# Patient Record
Sex: Female | Born: 1950 | Race: White | Hispanic: No | Marital: Married | Smoking: Never smoker
Health system: Southern US, Community
[De-identification: ages and names within clinical notes are randomized; demographics above are authoritative.]

## PROBLEM LIST (undated history)

## (undated) DIAGNOSIS — J45909 Unspecified asthma, uncomplicated: Secondary | ICD-10-CM

## (undated) DIAGNOSIS — K5790 Diverticulosis of intestine, part unspecified, without perforation or abscess without bleeding: Secondary | ICD-10-CM

## (undated) DIAGNOSIS — Z9289 Personal history of other medical treatment: Secondary | ICD-10-CM

## (undated) DIAGNOSIS — T4145XA Adverse effect of unspecified anesthetic, initial encounter: Secondary | ICD-10-CM

## (undated) DIAGNOSIS — I480 Paroxysmal atrial fibrillation: Secondary | ICD-10-CM

## (undated) DIAGNOSIS — J302 Other seasonal allergic rhinitis: Secondary | ICD-10-CM

## (undated) DIAGNOSIS — M1712 Unilateral primary osteoarthritis, left knee: Secondary | ICD-10-CM

## (undated) DIAGNOSIS — T8859XA Other complications of anesthesia, initial encounter: Secondary | ICD-10-CM

## (undated) DIAGNOSIS — Z8709 Personal history of other diseases of the respiratory system: Secondary | ICD-10-CM

## (undated) HISTORY — PX: TUBAL LIGATION: SHX77

## (undated) HISTORY — PX: ESOPHAGOGASTRODUODENOSCOPY: SHX1529

## (undated) HISTORY — PX: OTHER SURGICAL HISTORY: SHX169

## (undated) HISTORY — PX: REFRACTIVE SURGERY: SHX103

---

## 1977-05-21 DIAGNOSIS — Z9289 Personal history of other medical treatment: Secondary | ICD-10-CM

## 1977-05-21 HISTORY — DX: Personal history of other medical treatment: Z92.89

## 2003-02-26 ENCOUNTER — Ambulatory Visit (HOSPITAL_COMMUNITY): Admission: RE | Admit: 2003-02-26 | Discharge: 2003-02-26 | Payer: Self-pay | Admitting: Internal Medicine

## 2004-04-03 ENCOUNTER — Emergency Department (HOSPITAL_COMMUNITY): Admission: EM | Admit: 2004-04-03 | Discharge: 2004-04-03 | Payer: Self-pay | Admitting: Emergency Medicine

## 2006-08-29 ENCOUNTER — Encounter: Admission: RE | Admit: 2006-08-29 | Discharge: 2006-08-29 | Payer: Self-pay | Admitting: Obstetrics and Gynecology

## 2007-05-21 ENCOUNTER — Emergency Department (HOSPITAL_COMMUNITY): Admission: EM | Admit: 2007-05-21 | Discharge: 2007-05-21 | Payer: Self-pay | Admitting: Emergency Medicine

## 2010-10-06 NOTE — Op Note (Signed)
NAME:  Mary, [name]                           ACCOUNT NO.:  orourk   MEDICAL RECORD NO.:  [id number]                   PATIENT TYPE:  AMB   LOCATION:  DAY                                  FACILITY:  [facility]   PHYSICIAN:  R. Roetta Sessions, M.D.              DATE OF BIRTH:  07-20-1950   DATE OF PROCEDURE:  02/26/2003  DATE OF DISCHARGE:                                 OPERATIVE REPORT   PROCEDURE:  Screening colonoscopy.   INDICATIONS FOR PROCEDURE:  The patient is a 60 year old lady sent over by  the courtesy of Dr. Almetta Lovely for colorectal carcinoma screening.  She  is devoid of any lower GI tract symptoms.  There is no family history of  colorectal neoplasia.  She has never had her lower GI tract evaluated.  Colonoscopy is now being done as a standard screening maneuver.  This  approach has been discussed with the patient.  The potential risks,  benefits, and alternatives have been reviewed.  Please see my handwritten  H&P for more information.   PROCEDURE:  O2 saturation, blood pressure, pulses, and respirations were  monitored throughout the entire procedure.  Conscious sedation was with  Versed 4 mg IV, Demerol 75 mg IV in divided doses.  The instrument used was  the Olympus video chip adult colonoscope.   FINDINGS:  Digital rectal examination revealed no abnormalities.   ENDOSCOPIC FINDINGS:  The prep was good.   Rectum:  Examination of the rectal mucosa revealed a relatively narrow  rectum.  I was unable to reflex; however, I was able to see the rectum very  well en face, and it appeared normal.   Colon:  The colonic mucosa was surveyed from the rectosigmoid junction  through the left, transverse, right colon to the area of the appendiceal  orifice, ileocecal valve, and cecum.  These structures were well-seen and  photographed for the record.  The patient was noted to have scattered narrow-  mouth sigmoid diverticula.  The remainder of the colonic mucosa appeared  normal.  From the level of the cecum and ileocecal valve, the scope was  slowly withdrawn.  All previously mentioned mucosal surfaces were again  seen, and no other abnormalities were observed.  The patient tolerated the  procedure well and was reactive in endoscopy.   IMPRESSION:  1. Normal rectum.  2. A few scattered sigmoid diverticula.  The remainder of the colonic mucosa     appeared normal.    RECOMMENDATIONS:  1. Diverticulosis literature provided to [name].  2. Repeat colonoscopy in 10 years.      ___________________________________________                                            Jonathon Bellows, M.D.   RMR/MEDQ  D:  02/26/2003  T:  02/26/2003  Job:  [id number]   cc:   Almetta Lovely  [address]  [place]  [place] [address]  Fax: [phone]   Kirk Ruths, M.D.  [address]  [place]  [place] [address]  Fax: [phone]

## 2011-02-23 LAB — CBC
Hemoglobin: 14
MCHC: 34.3
MCV: 86.5
WBC: 7.2

## 2011-02-23 LAB — BASIC METABOLIC PANEL
BUN: 11
Creatinine, Ser: 0.93
GFR calc Af Amer: >60
GFR calc non Af Amer: >60
Potassium: 3.6

## 2011-02-23 LAB — CULTURE, BLOOD (ROUTINE X 2)
Culture: NO GROWTH
Culture: NO GROWTH

## 2011-02-23 LAB — DIFFERENTIAL: Monocytes Absolute: 0.6

## 2013-09-30 ENCOUNTER — Telehealth: Payer: Self-pay | Admitting: *Deleted

## 2013-09-30 DIAGNOSIS — Z1211 Encounter for screening for malignant neoplasm of colon: Secondary | ICD-10-CM

## 2013-09-30 NOTE — Telephone Encounter (Signed)
Pt called to schedule her colonoscopy. Please advise [phone]

## 2013-10-06 ENCOUNTER — Other Ambulatory Visit: Payer: Self-pay

## 2013-10-06 DIAGNOSIS — Z1211 Encounter for screening for malignant neoplasm of colon: Secondary | ICD-10-CM

## 2013-10-06 NOTE — Telephone Encounter (Signed)
Gastroenterology Pre-Procedure Review  Request Date: 10/06/2013 Requesting Physician: ON RECALL  Last colonoscopy was 02/26/2003 by Dr. Jena Gaussourk and the next was due in 10 years  PATIENT REVIEW QUESTIONS: The patient responded to the following health history questions as indicated:    1. Diabetes Melitis: no 2. Joint replacements in the past 12 months: no 3. Major health problems in the past 3 months: no 4. Has an artificial valve or MVP: no 5. Has a defibrillator: no 6. Has been advised in past to take antibiotics in advance of a procedure like teeth cleaning: no    MEDICATIONS & ALLERGIES:    Patient reports the following regarding taking any blood thinners:   Plavix? no Aspirin? no Coumadin? no  Patient confirms/reports the following medications:  Current Outpatient Prescriptions  Medication Sig Dispense Refill  . ibuprofen (ADVIL,MOTRIN) 200 MG tablet Take 200 mg by mouth every 6 (six) hours as needed. Uses occasionally      . Multiple Vitamin (MULTIVITAMIN) tablet Take 1 tablet by mouth daily.      . NON FORMULARY Calcium 600 mg with Vit D   One tablet daily      . vitamin C (ASCORBIC ACID) 250 MG tablet Take 250 mg by mouth daily.       No current facility-administered medications for this visit.    Patient confirms/reports the following allergies:  Allergies  Allergen Reactions  . Penicillins Hives and Swelling    No orders of the defined types were placed in this encounter.    AUTHORIZATION INFORMATION Primary Insurance:   ID #:   Group #:  Pre-Cert / Auth required:  Pre-Cert / Auth #:   Secondary Insurance:   ID #:   Group #:  Pre-Cert / Auth required: Pre-Cert / Auth #:   SCHEDULE INFORMATION: Procedure has been scheduled as follows:  Date: 11/02/2013                 Time:  10:30 AM Location: [facility] Short Stay  This Gastroenterology Pre-Precedure Review Form is being routed to the following provider(s): R. Roetta SessionsMichael Rourk, MD

## 2013-10-07 MED ORDER — PEG-KCL-NACL-NASULF-NA ASC-C 100 G PO SOLR: 1.0000 | ORAL | Status: DC

## 2013-10-07 NOTE — Telephone Encounter (Signed)
Rx sent to the pharmacy and instructions mailed to pt.  

## 2013-10-07 NOTE — Telephone Encounter (Signed)
Appropriate.

## 2013-10-07 NOTE — Addendum Note (Signed)
Addended by: Lavena BullionSTEWART, Johnda Billiot H on: 10/07/2013 02:38 PM   Modules accepted: Orders

## 2013-10-20 ENCOUNTER — Encounter (HOSPITAL_COMMUNITY): Payer: Self-pay | Admitting: Pharmacy Technician

## 2013-11-02 ENCOUNTER — Encounter (HOSPITAL_COMMUNITY)
Admission: RE | Disposition: A | Discharge status: Home / Self Care | Payer: Self-pay | Source: Ambulatory Visit | Attending: Internal Medicine

## 2013-11-02 ENCOUNTER — Encounter (HOSPITAL_COMMUNITY): Payer: Self-pay | Admitting: *Deleted

## 2013-11-02 ENCOUNTER — Ambulatory Visit (HOSPITAL_COMMUNITY)
Admission: RE | Admit: 2013-11-02 | Discharge: 2013-11-02 | Disposition: A | Discharge status: Home / Self Care | Payer: BC Managed Care – PPO | Source: Ambulatory Visit | Attending: Internal Medicine | Admitting: Internal Medicine

## 2013-11-02 DIAGNOSIS — K5730 Diverticulosis of large intestine without perforation or abscess without bleeding: Secondary | ICD-10-CM

## 2013-11-02 DIAGNOSIS — Z79899 Other long term (current) drug therapy: Secondary | ICD-10-CM | POA: Insufficient documentation

## 2013-11-02 DIAGNOSIS — Z1211 Encounter for screening for malignant neoplasm of colon: Secondary | ICD-10-CM | POA: Insufficient documentation

## 2013-11-02 DIAGNOSIS — Z880 Allergy status to penicillin: Secondary | ICD-10-CM | POA: Insufficient documentation

## 2013-11-02 HISTORY — PX: COLONOSCOPY: SHX5424

## 2013-11-02 HISTORY — DX: Other seasonal allergic rhinitis: J30.2

## 2013-11-02 SURGERY — COLONOSCOPY
Anesthesia: Moderate Sedation

## 2013-11-02 MED ORDER — MEPERIDINE HCL 100 MG/ML IJ SOLN
INTRAMUSCULAR | Status: AC
  Filled 2013-11-02: qty 2

## 2013-11-02 MED ORDER — MEPERIDINE HCL 100 MG/ML IJ SOLN
INTRAMUSCULAR | Status: DC | PRN
  Administered 2013-11-02: 50 mg via INTRAVENOUS
  Administered 2013-11-02: 25 mg via INTRAVENOUS

## 2013-11-02 MED ORDER — SODIUM CHLORIDE 0.9 % IV SOLN
INTRAVENOUS | Status: DC
  Administered 2013-11-02: 10:00:00 via INTRAVENOUS

## 2013-11-02 MED ORDER — STERILE WATER FOR IRRIGATION IR SOLN
Status: DC | PRN
  Administered 2013-11-02: 11:00:00

## 2013-11-02 MED ORDER — MIDAZOLAM HCL 5 MG/5ML IJ SOLN
INTRAMUSCULAR | Status: AC
  Filled 2013-11-02: qty 10

## 2013-11-02 MED ORDER — MIDAZOLAM HCL 5 MG/5ML IJ SOLN
INTRAMUSCULAR | Status: DC | PRN
  Administered 2013-11-02 (×2): 2 mg via INTRAVENOUS
  Administered 2013-11-02: 1 mg via INTRAVENOUS

## 2013-11-02 MED ORDER — ONDANSETRON HCL 4 MG/2ML IJ SOLN
INTRAMUSCULAR | Status: DC | PRN
  Administered 2013-11-02: 4 mg via INTRAVENOUS

## 2013-11-02 MED ORDER — ONDANSETRON HCL 4 MG/2ML IJ SOLN
INTRAMUSCULAR | Status: AC
  Filled 2013-11-02: qty 2

## 2013-11-02 NOTE — H&P (Signed)
@LOGO @   Primary Care Physician:  Catalina PizzaHALL, ZACH, MD Primary Gastroenterologist:  Dr. Jena Gaussourk  Pre-Procedure History & Physical: HPI:  Mary [name] is a 63 y.o. female is here for a screening colonoscopy.  No bowel symptoms. No family history of colon cancer. Last exam 2004-negative.  Past Medical History  Diagnosis Date  . Seasonal allergies     Past Surgical History  Procedure Laterality Date  . Colonoscopy    . Right knee arthroscopy      Prior to Admission medications   Medication Sig Start Date End Date Taking? Authorizing Provider  fluticasone (FLONASE) 50 MCG/ACT nasal spray Place into both nostrils daily.   Yes Historical Provider, MD  ibuprofen (ADVIL,MOTRIN) 200 MG tablet Take 200 mg by mouth every 6 (six) hours as needed. Uses occasionally   Yes Historical Provider, MD  Multiple Vitamin (MULTIVITAMIN) tablet Take 1 tablet by mouth daily.   Yes Historical Provider, MD  NON FORMULARY Calcium 600 mg with Vit D   One tablet daily   Yes Historical Provider, MD  vitamin C (ASCORBIC ACID) 250 MG tablet Take 250 mg by mouth daily.   Yes Historical Provider, MD    Allergies as of 10/06/2013 - Review Complete 10/06/2013  Allergen Reaction Noted  . Penicillins Hives and Swelling 10/06/2013    Family History  Problem Relation Age of Onset  . Colon cancer Neg Hx     History   Social History  . Marital Status: Married    Spouse Name: N/A    Number of Children: N/A  . Years of Education: N/A   Occupational History  . Not on file.   Social History Main Topics  . Smoking status: Never Smoker   . Smokeless tobacco: Not on file  . Alcohol Use: Yes     Comment: occasionally  . Drug Use: No  . Sexual Activity: Not on file   Other Topics Concern  . Not on file   Social History Narrative  . No narrative on file    Review of Systems: See HPI, otherwise negative ROS  Physical Exam: BP 136/89  Pulse 68  Temp(Src) 98.2 F (36.8 C) (Oral)  Resp 18  Ht 5' 5.5"  (1.664 m)  Wt 170 lb (77.111 kg)  BMI 27.85 kg/m2  SpO2 97% General:   Alert,  Well-developed, well-nourished, pleasant and cooperative in NAD Head:  Normocephalic and atraumatic. Eyes:  Sclera clear, no icterus.   Conjunctiva pink. Ears:  Normal auditory acuity. Nose:  No deformity, discharge,  or lesions. Mouth:  No deformity or lesions, dentition normal. Neck:  Supple; no masses or thyromegaly. Lungs:  Clear throughout to auscultation.   No wheezes, crackles, or rhonchi. No acute distress. Heart:  Regular rate and rhythm; no murmurs, clicks, rubs,  or gallops. Abdomen:  Soft, nontender and nondistended. No masses, hepatosplenomegaly or hernias noted. Normal bowel sounds, without guarding, and without rebound.   Msk:  Symmetrical without gross deformities. Normal posture. Pulses:  Normal pulses noted. Extremities:  Without clubbing or edema. Neurologic:  Alert and  oriented x4;  grossly normal neurologically. Skin:  Intact without significant lesions or rashes. Cervical Nodes:  No significant cervical adenopathy. Psych:  Alert and cooperative. Normal mood and affect.  Impression/Plan: Mary [name] is now here to undergo a screening colonoscopy. Average risk colorectal cancer screening examination. Risks, benefits, limitations, imponderables and alternatives regarding colonoscopy have been reviewed with the patient. Questions have been answered. All parties agreeable.     Notice:  This dictation was prepared with Dragon dictation along with smaller phrase technology. Any transcriptional errors that result from this process are unintentional and may not be corrected upon review.

## 2013-11-02 NOTE — Op Note (Signed)
[facility] [address] [place], [id number]   COLONOSCOPY PROCEDURE REPORT  PATIENT: Mary [name], Mary K.  MR#:         [id number] BIRTHDATE: September 08, 1950 , 63  yrs. old GENDER: Female ENDOSCOPIST: R.  Roetta SessionsMichael Ettore Trebilcock, MD FACP FACG REFERRED BY:  Catalina PizzaZach Hall, M.D. PROCEDURE DATE:  11/02/2013 PROCEDURE:     Screening colonoscopy  INDICATIONS: Average risk colorectal cancer screening examination  INFORMED CONSENT:  The risks, benefits, alternatives and imponderables including but not limited to bleeding, perforation as well as the possibility of a missed lesion have been reviewed.  The potential for biopsy, lesion removal, etc. have also been discussed.  Questions have been answered.  All parties agreeable. Please see the history and physical in the medical record for more information.  MEDICATIONS: Versed 5 mg IV and Demerol 75 mg IV in divided doses. Zofran 4 mg IV.  DESCRIPTION OF PROCEDURE:  After a digital rectal exam was performed, the EC-[id number] ([id number]  colonoscope was advanced from the anus through the rectum and colon to the area of the cecum, ileocecal valve and appendiceal orifice.  The cecum was deeply intubated.  These structures were well-seen and photographed for the record.  From the level of the cecum and ileocecal valve, the scope was slowly and cautiously withdrawn.  The mucosal surfaces were carefully surveyed utilizing scope tip deflection to facilitate fold flattening as needed.  The scope was pulled down into the rectum where a thorough examination including retroflexion was performed.    FINDINGS:  Adequate preparation. Minimal anal papilla; otherwise, normal rectum.  Left-sided diverticula; otherwise, the remainder of the colonic mucosa appeared normal.  THERAPEUTIC / DIAGNOSTIC MANEUVERS PERFORMED:  None  COMPLICATIONS: None  CECAL WITHDRAWAL TIME:  9 minutes  IMPRESSION:  Colonic diverticulosis  RECOMMENDATIONS: Return for one more  screening colonoscopy in 10 years   _______________________________ eSigned:  R. Roetta SessionsMichael Alejah Aristizabal, MD FACP [facility] 11/02/2013 11:13 AM   CC:    PATIENT NAME:  Mary [name], Mary K. MR#: [id number]

## 2013-11-02 NOTE — Discharge Instructions (Addendum)
Colonoscopy Discharge Instructions  Read the instructions outlined below and refer to this sheet in the next few weeks. These discharge instructions provide you with general information on caring for yourself after you leave the hospital. Your doctor may also give you specific instructions. While your treatment has been planned according to the most current medical practices available, unavoidable complications occasionally occur. If you have any problems or questions after discharge, call Dr. Jena Gaussourk at [phone]. ACTIVITY  You may resume your regular activity, but move at a slower pace for the next 24 hours.   Take frequent rest periods for the next 24 hours.   Walking will help get rid of the air and reduce the bloated feeling in your belly (abdomen).   No driving for 24 hours (because of the medicine (anesthesia) used during the test).    Do not sign any important legal documents or operate any machinery for 24 hours (because of the anesthesia used during the test).  NUTRITION  Drink plenty of fluids.   You may resume your normal diet as instructed by your doctor.   Begin with a light meal and progress to your normal diet. Heavy or fried foods are harder to digest and may make you feel sick to your stomach (nauseated).   Avoid alcoholic beverages for 24 hours or as instructed.  MEDICATIONS  You may resume your normal medications unless your doctor tells you otherwise.  WHAT YOU CAN EXPECT TODAY  Some feelings of bloating in the abdomen.   Passage of more gas than usual.   Spotting of blood in your stool or on the toilet paper.  IF YOU HAD POLYPS REMOVED DURING THE COLONOSCOPY:  No aspirin products for 7 days or as instructed.   No alcohol for 7 days or as instructed.   Eat a soft diet for the next 24 hours.  FINDING OUT THE RESULTS OF YOUR TEST Not all test results are available during your visit. If your test results are not back during the visit, make an appointment  with your caregiver to find out the results. Do not assume everything is normal if you have not heard from your caregiver or the medical facility. It is important for you to follow up on all of your test results.  SEEK IMMEDIATE MEDICAL ATTENTION IF:  You have more than a spotting of blood in your stool.   Your belly is swollen (abdominal distention).   You are nauseated or vomiting.   You have a temperature over 101.   You have abdominal pain or discomfort that is severe or gets worse throughout the day.     Diverticulosis information  Return for one more screening colonoscopy in 10 years  Diverticulosis Diverticulosis is a common condition that develops when small pouches (diverticula) form in the wall of the colon. The risk of diverticulosis increases with age. It happens more often in people who eat a low-fiber diet. Most individuals with diverticulosis have no symptoms. Those individuals with symptoms usually experience abdominal pain, constipation, or loose stools (diarrhea). HOME CARE INSTRUCTIONS   Increase the amount of fiber in your diet as directed by your caregiver or dietician. This may reduce symptoms of diverticulosis.  Your caregiver may recommend taking a dietary fiber supplement.  Drink at least 6 to 8 glasses of water each day to prevent constipation.  Try not to strain when you have a bowel movement.  Your caregiver may recommend avoiding nuts and seeds to prevent complications, although this is still an  uncertain benefit.  Only take over-the-counter or prescription medicines for pain, discomfort, or fever as directed by your caregiver. FOODS WITH HIGH FIBER CONTENT INCLUDE:  Fruits. Apple, peach, pear, tangerine, raisins, prunes.  Vegetables. Brussels sprouts, asparagus, broccoli, cabbage, carrot, cauliflower, romaine lettuce, spinach, summer squash, tomato, winter squash, zucchini.  Starchy Vegetables. Baked beans, kidney beans, lima beans, split peas,  lentils, potatoes (with skin).  Grains. Whole wheat bread, brown rice, bran flake cereal, plain oatmeal, white rice, shredded wheat, bran muffins. SEEK IMMEDIATE MEDICAL CARE IF:   You develop increasing pain or severe bloating.  You have an oral temperature above 102 F (38.9 C), not controlled by medicine.  You develop vomiting or bowel movements that are bloody or black. Document Released: 02/02/2004 Document Revised: 07/30/2011 Document Reviewed: 10/05/2009 [facility] Patient Information 2014 [employer] [place].

## 2013-11-04 ENCOUNTER — Encounter (HOSPITAL_COMMUNITY): Payer: Self-pay | Admitting: Internal Medicine

## 2014-05-21 DIAGNOSIS — Z8709 Personal history of other diseases of the respiratory system: Secondary | ICD-10-CM

## 2014-05-21 HISTORY — DX: Personal history of other diseases of the respiratory system: Z87.09

## 2015-01-10 ENCOUNTER — Other Ambulatory Visit: Payer: Self-pay | Admitting: Obstetrics and Gynecology

## 2015-01-10 DIAGNOSIS — R928 Other abnormal and inconclusive findings on diagnostic imaging of breast: Secondary | ICD-10-CM

## 2015-01-19 ENCOUNTER — Inpatient Hospital Stay: Admission: RE | Admit: 2015-01-19 | Payer: BC Managed Care – PPO | Source: Ambulatory Visit

## 2015-01-21 ENCOUNTER — Ambulatory Visit
Admission: RE | Admit: 2015-01-21 | Discharge: 2015-01-21 | Disposition: A | Discharge status: Home / Self Care | Payer: BC Managed Care – PPO | Source: Ambulatory Visit | Attending: Obstetrics and Gynecology | Admitting: Obstetrics and Gynecology

## 2015-01-21 ENCOUNTER — Other Ambulatory Visit: Payer: Self-pay | Admitting: Obstetrics and Gynecology

## 2015-01-21 DIAGNOSIS — R928 Other abnormal and inconclusive findings on diagnostic imaging of breast: Secondary | ICD-10-CM

## 2015-11-15 ENCOUNTER — Other Ambulatory Visit: Payer: Self-pay | Admitting: Orthopedic Surgery

## 2015-11-18 ENCOUNTER — Encounter (HOSPITAL_COMMUNITY): Payer: Self-pay

## 2015-11-18 ENCOUNTER — Encounter (HOSPITAL_COMMUNITY)
Admission: RE | Admit: 2015-11-18 | Discharge: 2015-11-18 | Disposition: A | Discharge status: Home / Self Care | Payer: BC Managed Care – PPO | Source: Ambulatory Visit | Attending: Orthopedic Surgery | Admitting: Orthopedic Surgery

## 2015-11-18 DIAGNOSIS — M1712 Unilateral primary osteoarthritis, left knee: Secondary | ICD-10-CM | POA: Insufficient documentation

## 2015-11-18 DIAGNOSIS — Z01812 Encounter for preprocedural laboratory examination: Secondary | ICD-10-CM | POA: Diagnosis not present

## 2015-11-18 DIAGNOSIS — Z01818 Encounter for other preprocedural examination: Secondary | ICD-10-CM | POA: Diagnosis not present

## 2015-11-18 HISTORY — DX: Other complications of anesthesia, initial encounter: T88.59XA

## 2015-11-18 HISTORY — DX: Adverse effect of unspecified anesthetic, initial encounter: T41.45XA

## 2015-11-18 HISTORY — DX: Unspecified asthma, uncomplicated: J45.909

## 2015-11-18 HISTORY — DX: Personal history of other diseases of the respiratory system: Z87.09

## 2015-11-18 HISTORY — DX: Diverticulosis of intestine, part unspecified, without perforation or abscess without bleeding: K57.90

## 2015-11-18 HISTORY — DX: Personal history of other medical treatment: Z92.89

## 2015-11-18 LAB — CBC
HEMATOCRIT: 42.2 % (ref 36.0–46.0)
HEMOGLOBIN: 13.8 g/dL (ref 12.0–15.0)
MCH: 28.4 pg (ref 26.0–34.0)
MCHC: 32.7 g/dL (ref 30.0–36.0)
MCV: 86.8 fL (ref 78.0–100.0)
Platelets: 250 10*3/uL (ref 150–400)
RBC: 4.86 MIL/uL (ref 3.87–5.11)
RDW: 13 % (ref 11.5–15.5)
WBC: 7 10*3/uL (ref 4.0–10.5)

## 2015-11-18 LAB — BASIC METABOLIC PANEL
ANION GAP: 5 (ref 5–15)
BUN: 9 mg/dL (ref 6–20)
CALCIUM: 9.5 mg/dL (ref 8.9–10.3)
CHLORIDE: 106 mmol/L (ref 101–111)
CO2: 28 mmol/L (ref 22–32)
Creatinine, Ser: 0.92 mg/dL (ref 0.44–1.00)
GFR calc non Af Amer: >60 mL/min (ref >60)
GLUCOSE: 98 mg/dL (ref 65–99)
POTASSIUM: 3.9 mmol/L (ref 3.5–5.1)
Sodium: 139 mmol/L (ref 135–145)

## 2015-11-18 LAB — SURGICAL PCR SCREEN
MRSA, PCR: NEGATIVE
Staphylococcus aureus: NEGATIVE

## 2015-11-18 NOTE — Pre-Procedure Instructions (Signed)
Mary RossSandra K [name]  11/18/2015      [facility] - [place], [place] - [address] [place] [address] Phone: [phone] Fax: [phone]    Your procedure is scheduled on Tues, July 11 @ 10:10 AM  [facility] at 8:00 AM  Call this number if you have problems the morning of surgery:  [phone]   Remember:  Do not eat food or drink liquids after midnight.  Take these medicines the morning of surgery with A SIP OF WATER Zyrtec(Cetirizine) and Flonase(Fluticasone)             Stop taking Aleve and Ibuprofen a week prior to surgery. No Goody's,BC's,Advil,Motrin,Fish Oil,or any Herbal Medications.    Do not wear jewelry, make-up or nail polish.  Do not wear lotions, powders, or perfumes.   Do not shave 48 hours prior to surgery.    Do not bring valuables to the hospital.  [facility] any belongings or valuables.  Contacts, dentures or bridgework may not be worn into surgery.  Leave your suitcase in the car.  After surgery it may be brought to your room.  For patients admitted to the hospital, discharge time will be determined by your treatment team.  Patients discharged the day of surgery will not be allowed to drive home.    [facility] - Preparing [facility]  Before surgery, you can play an important role.  Because skin is not sterile, your skin needs to be as free of germs as possible.  You can reduce the number of germs on you skin by washing with CHG (chlorahexidine gluconate) soap before surgery.  CHG is an antiseptic cleaner which kills germs and bonds with the skin to continue killing germs even after washing.  Please DO NOT use if you have an allergy to CHG or antibacterial soaps.  If your skin becomes reddened/irritated stop using the CHG and inform your nurse when you arrive at Short Stay.  Do not shave (including legs and underarms) for at least 48 hours prior to the  first CHG shower.  You may shave your face.  Please follow these instructions carefully:   1.  Shower with CHG Soap the night before surgery and the                                morning of Surgery.  2.  If you choose to wash your hair, wash your hair first as usual with your       normal shampoo.  3.  After you shampoo, rinse your hair and body thoroughly to remove the                      Shampoo.  4.  Use CHG as you would any other liquid soap.  You can apply chg directly       to the skin and wash gently with scrungie or a clean washcloth.  5.  Apply the CHG Soap to your body ONLY FROM THE NECK DOWN.        Do not use on open wounds or open sores.  Avoid contact with your eyes,       ears, mouth and genitals (private parts).  Wash genitals (private parts)       with your normal soap.  6.  Wash thoroughly, paying special attention  to the area where your surgery        will be performed.  7.  Thoroughly rinse your body with warm water from the neck down.  8.  DO NOT shower/wash with your normal soap after using and rinsing off       the CHG Soap.  9.  Pat yourself dry with a clean towel.            10.  Wear clean pajamas.            11.  Place clean sheets on your bed the night of your first shower and do not        sleep with pets.  Day of Surgery  Do not apply any lotions/deoderants the morning of surgery.  Please wear clean clothes to the hospital/surgery center.  :   Please read over the following fact sheets that you were given. Pain Booklet, Coughing and Deep Breathing, MRSA Information and Surgical Site Infection Prevention

## 2015-11-18 NOTE — Progress Notes (Addendum)
Cardiologist denies  Medical Md is Dr Catalina PizzaZach Hall  Echo denies  Stress test denies  Heart cath denies  EKG denies  CXR denies

## 2015-11-29 ENCOUNTER — Observation Stay (HOSPITAL_COMMUNITY)
Admission: RE | Admit: 2015-11-29 | Discharge: 2015-11-30 | Disposition: A | Discharge status: Home / Self Care | Payer: BC Managed Care – PPO | Source: Ambulatory Visit | Attending: Orthopedic Surgery | Admitting: Orthopedic Surgery

## 2015-11-29 ENCOUNTER — Inpatient Hospital Stay (HOSPITAL_COMMUNITY): Payer: BC Managed Care – PPO | Admitting: Anesthesiology

## 2015-11-29 ENCOUNTER — Inpatient Hospital Stay (HOSPITAL_COMMUNITY): Payer: BC Managed Care – PPO

## 2015-11-29 ENCOUNTER — Encounter (HOSPITAL_COMMUNITY): Payer: Self-pay | Admitting: Certified Registered Nurse Anesthetist

## 2015-11-29 ENCOUNTER — Encounter (HOSPITAL_COMMUNITY)
Admission: RE | Disposition: A | Discharge status: Home / Self Care | Payer: Self-pay | Source: Ambulatory Visit | Attending: Orthopedic Surgery

## 2015-11-29 DIAGNOSIS — J45909 Unspecified asthma, uncomplicated: Secondary | ICD-10-CM | POA: Diagnosis not present

## 2015-11-29 DIAGNOSIS — M1712 Unilateral primary osteoarthritis, left knee: Principal | ICD-10-CM | POA: Diagnosis present

## 2015-11-29 DIAGNOSIS — Z96652 Presence of left artificial knee joint: Secondary | ICD-10-CM

## 2015-11-29 HISTORY — PX: PARTIAL KNEE ARTHROPLASTY: SHX2174

## 2015-11-29 HISTORY — DX: Unilateral primary osteoarthritis, left knee: M17.12

## 2015-11-29 HISTORY — PX: KNEE ARTHROSCOPY: SHX127

## 2015-11-29 SURGERY — ARTHROSCOPY, KNEE
Anesthesia: Spinal | Site: Knee | Laterality: Left

## 2015-11-29 MED ORDER — MIDAZOLAM HCL 2 MG/2ML IJ SOLN
INTRAMUSCULAR | Status: AC
  Filled 2015-11-29: qty 2

## 2015-11-29 MED ORDER — MENTHOL 3 MG MT LOZG: 1.0000 | LOZENGE | OROMUCOSAL | Status: DC | PRN

## 2015-11-29 MED ORDER — BUPIVACAINE HCL (PF) 0.75 % IJ SOLN
INTRAMUSCULAR | Status: DC | PRN
  Administered 2015-11-29: 2 mL via INTRATHECAL

## 2015-11-29 MED ORDER — FENTANYL CITRATE (PF) 250 MCG/5ML IJ SOLN
INTRAMUSCULAR | Status: DC | PRN
  Administered 2015-11-29: 50 ug via INTRAVENOUS
  Administered 2015-11-29 (×2): 100 ug via INTRAVENOUS
  Administered 2015-11-29: 50 ug via INTRAVENOUS
  Administered 2015-11-29 (×6): 25 ug via INTRAVENOUS
  Administered 2015-11-29: 50 ug via INTRAVENOUS

## 2015-11-29 MED ORDER — PROPOFOL 10 MG/ML IV BOLUS
INTRAVENOUS | Status: DC | PRN
  Administered 2015-11-29: 20 mg via INTRAVENOUS
  Administered 2015-11-29: 80 mg via INTRAVENOUS

## 2015-11-29 MED ORDER — METHOCARBAMOL 1000 MG/10ML IJ SOLN
500.0000 mg | Freq: Four times a day (QID) | INTRAVENOUS | Status: DC | PRN
  Filled 2015-11-29: qty 5

## 2015-11-29 MED ORDER — ONDANSETRON HCL 4 MG/2ML IJ SOLN
4.0000 mg | Freq: Four times a day (QID) | INTRAMUSCULAR | Status: DC | PRN
  Administered 2015-11-30: 4 mg via INTRAVENOUS
  Filled 2015-11-29: qty 2

## 2015-11-29 MED ORDER — CEFAZOLIN SODIUM-DEXTROSE 2-4 GM/100ML-% IV SOLN
2.0000 g | INTRAVENOUS | Status: AC
  Administered 2015-11-29: 2 g via INTRAVENOUS

## 2015-11-29 MED ORDER — PROMETHAZINE HCL 25 MG/ML IJ SOLN
6.2500 mg | INTRAMUSCULAR | Status: DC | PRN
Start: 2015-11-29 — End: 2015-11-29

## 2015-11-29 MED ORDER — CALCIUM CARBONATE-VITAMIN D 600-400 MG-UNIT PO TABS: ORAL_TABLET | Freq: Every morning | ORAL | Status: DC

## 2015-11-29 MED ORDER — LIDOCAINE HCL (CARDIAC) 20 MG/ML IV SOLN
INTRAVENOUS | Status: DC | PRN
  Administered 2015-11-29: 80 mg via INTRAVENOUS

## 2015-11-29 MED ORDER — LORATADINE 10 MG PO TABS
10.0000 mg | ORAL_TABLET | Freq: Every day | ORAL | Status: DC
  Administered 2015-11-30: 10 mg via ORAL
  Filled 2015-11-29: qty 1

## 2015-11-29 MED ORDER — CEFAZOLIN SODIUM-DEXTROSE 2-4 GM/100ML-% IV SOLN
INTRAVENOUS | Status: AC
  Filled 2015-11-29: qty 100

## 2015-11-29 MED ORDER — RIVAROXABAN 10 MG PO TABS: 10.0000 mg | ORAL_TABLET | Freq: Every day | ORAL | Status: DC

## 2015-11-29 MED ORDER — OXYCODONE HCL 5 MG PO TABS
ORAL_TABLET | ORAL | Status: AC
  Filled 2015-11-29: qty 2

## 2015-11-29 MED ORDER — KETOROLAC TROMETHAMINE 15 MG/ML IJ SOLN
7.5000 mg | Freq: Four times a day (QID) | INTRAMUSCULAR | Status: AC
  Administered 2015-11-29 – 2015-11-30 (×4): 7.5 mg via INTRAVENOUS
  Filled 2015-11-29 (×3): qty 1

## 2015-11-29 MED ORDER — MAGNESIUM CITRATE PO SOLN: 1.0000 | Freq: Once | ORAL | Status: DC | PRN

## 2015-11-29 MED ORDER — FENTANYL CITRATE (PF) 250 MCG/5ML IJ SOLN
INTRAMUSCULAR | Status: AC
  Filled 2015-11-29: qty 5

## 2015-11-29 MED ORDER — SODIUM CHLORIDE 0.9 % IR SOLN
Status: DC | PRN
  Administered 2015-11-29: 1000 mL
  Administered 2015-11-29: 6000 mL

## 2015-11-29 MED ORDER — OXYCODONE HCL 5 MG/5ML PO SOLN: 5.0000 mg | Freq: Once | ORAL | Status: AC | PRN

## 2015-11-29 MED ORDER — ALBUTEROL SULFATE HFA 108 (90 BASE) MCG/ACT IN AERS: 2.0000 | INHALATION_SPRAY | Freq: Four times a day (QID) | RESPIRATORY_TRACT | Status: DC | PRN

## 2015-11-29 MED ORDER — HYDROMORPHONE HCL 1 MG/ML IJ SOLN
INTRAMUSCULAR | Status: AC
  Filled 2015-11-29: qty 1

## 2015-11-29 MED ORDER — DEXAMETHASONE SODIUM PHOSPHATE 4 MG/ML IJ SOLN
INTRAMUSCULAR | Status: DC | PRN
  Administered 2015-11-29: 10 mg via INTRAVENOUS

## 2015-11-29 MED ORDER — PROPOFOL 500 MG/50ML IV EMUL
INTRAVENOUS | Status: DC | PRN
Start: 2015-11-29 — End: 2015-11-29
  Administered 2015-11-29: 75 ug/kg/min via INTRAVENOUS

## 2015-11-29 MED ORDER — RIVAROXABAN 10 MG PO TABS
10.0000 mg | ORAL_TABLET | Freq: Every day | ORAL | Status: DC
  Administered 2015-11-30: 10 mg via ORAL
  Filled 2015-11-29: qty 1

## 2015-11-29 MED ORDER — DEXAMETHASONE SODIUM PHOSPHATE 10 MG/ML IJ SOLN
10.0000 mg | Freq: Once | INTRAMUSCULAR | Status: AC
  Administered 2015-11-30: 10 mg via INTRAVENOUS
  Filled 2015-11-29: qty 1

## 2015-11-29 MED ORDER — LACTATED RINGERS IV SOLN
INTRAVENOUS | Status: DC
  Administered 2015-11-29 (×2): via INTRAVENOUS
  Administered 2015-11-29: 50 mL/h via INTRAVENOUS

## 2015-11-29 MED ORDER — CEFAZOLIN SODIUM-DEXTROSE 2-4 GM/100ML-% IV SOLN
2.0000 g | Freq: Four times a day (QID) | INTRAVENOUS | Status: AC
  Administered 2015-11-29 (×2): 2 g via INTRAVENOUS
  Filled 2015-11-29 (×2): qty 100

## 2015-11-29 MED ORDER — ONDANSETRON HCL 4 MG PO TABS: 4.0000 mg | ORAL_TABLET | Freq: Four times a day (QID) | ORAL | Status: DC | PRN

## 2015-11-29 MED ORDER — HYDROMORPHONE HCL 1 MG/ML IJ SOLN
0.2500 mg | INTRAMUSCULAR | Status: DC | PRN
  Administered 2015-11-29 (×4): 0.5 mg via INTRAVENOUS

## 2015-11-29 MED ORDER — METOCLOPRAMIDE HCL 5 MG/ML IJ SOLN: 5.0000 mg | Freq: Three times a day (TID) | INTRAMUSCULAR | Status: DC | PRN

## 2015-11-29 MED ORDER — PROPOFOL 1000 MG/100ML IV EMUL
INTRAVENOUS | Status: AC
  Filled 2015-11-29: qty 200

## 2015-11-29 MED ORDER — SENNA 8.6 MG PO TABS
1.0000 | ORAL_TABLET | Freq: Two times a day (BID) | ORAL | Status: DC
  Administered 2015-11-29 – 2015-11-30 (×2): 8.6 mg via ORAL
  Filled 2015-11-29 (×2): qty 1

## 2015-11-29 MED ORDER — OXYCODONE HCL 5 MG PO TABS
ORAL_TABLET | ORAL | Status: AC
  Filled 2015-11-29: qty 1

## 2015-11-29 MED ORDER — FLUTICASONE PROPIONATE 50 MCG/ACT NA SUSP
1.0000 | Freq: Every day | NASAL | Status: DC
Start: 2015-11-30 — End: 2015-11-30
  Administered 2015-11-30: 1 via NASAL
  Filled 2015-11-29: qty 16

## 2015-11-29 MED ORDER — METHOCARBAMOL 500 MG PO TABS
ORAL_TABLET | ORAL | Status: AC
  Filled 2015-11-29: qty 1

## 2015-11-29 MED ORDER — HYDROMORPHONE HCL 1 MG/ML IJ SOLN
0.5000 mg | INTRAMUSCULAR | Status: DC | PRN
  Administered 2015-11-29 – 2015-11-30 (×5): 0.5 mg via INTRAVENOUS
  Filled 2015-11-29 (×6): qty 1

## 2015-11-29 MED ORDER — POLYETHYLENE GLYCOL 3350 17 G PO PACK: 17.0000 g | PACK | Freq: Every day | ORAL | Status: DC | PRN

## 2015-11-29 MED ORDER — BISACODYL 10 MG RE SUPP: 10.0000 mg | Freq: Every day | RECTAL | Status: DC | PRN

## 2015-11-29 MED ORDER — SUGAMMADEX SODIUM 200 MG/2ML IV SOLN
INTRAVENOUS | Status: AC
  Filled 2015-11-29: qty 2

## 2015-11-29 MED ORDER — KETOROLAC TROMETHAMINE 15 MG/ML IJ SOLN
INTRAMUSCULAR | Status: AC
  Filled 2015-11-29: qty 1

## 2015-11-29 MED ORDER — BACLOFEN 10 MG PO TABS: 10.0000 mg | ORAL_TABLET | Freq: Three times a day (TID) | ORAL | Status: DC

## 2015-11-29 MED ORDER — ALUM & MAG HYDROXIDE-SIMETH 200-200-20 MG/5ML PO SUSP: 30.0000 mL | ORAL | Status: DC | PRN

## 2015-11-29 MED ORDER — VITAMIN C 500 MG PO TABS: 250.0000 mg | ORAL_TABLET | Freq: Every day | ORAL | Status: DC | PRN

## 2015-11-29 MED ORDER — PHENOL 1.4 % MT LIQD: 1.0000 | OROMUCOSAL | Status: DC | PRN

## 2015-11-29 MED ORDER — DIPHENHYDRAMINE HCL 12.5 MG/5ML PO ELIX: 12.5000 mg | ORAL_SOLUTION | ORAL | Status: DC | PRN

## 2015-11-29 MED ORDER — POTASSIUM CHLORIDE IN NACL 20-0.45 MEQ/L-% IV SOLN
INTRAVENOUS | Status: DC
  Administered 2015-11-29: 17:00:00 via INTRAVENOUS
  Filled 2015-11-29 (×4): qty 1000

## 2015-11-29 MED ORDER — ACETAMINOPHEN 650 MG RE SUPP: 650.0000 mg | Freq: Four times a day (QID) | RECTAL | Status: DC | PRN

## 2015-11-29 MED ORDER — ONDANSETRON HCL 4 MG PO TABS: 4.0000 mg | ORAL_TABLET | Freq: Three times a day (TID) | ORAL | Status: DC | PRN

## 2015-11-29 MED ORDER — OXYCODONE HCL 5 MG PO TABS
5.0000 mg | ORAL_TABLET | Freq: Once | ORAL | Status: AC | PRN
  Administered 2015-11-29: 5 mg via ORAL

## 2015-11-29 MED ORDER — 0.9 % SODIUM CHLORIDE (POUR BTL) OPTIME
TOPICAL | Status: DC | PRN
  Administered 2015-11-29: 1000 mL

## 2015-11-29 MED ORDER — DOCUSATE SODIUM 100 MG PO CAPS
100.0000 mg | ORAL_CAPSULE | Freq: Two times a day (BID) | ORAL | Status: DC
Start: 2015-11-29 — End: 2015-11-30
  Administered 2015-11-29 – 2015-11-30 (×2): 100 mg via ORAL
  Filled 2015-11-29 (×2): qty 1

## 2015-11-29 MED ORDER — METOCLOPRAMIDE HCL 5 MG PO TABS: 5.0000 mg | ORAL_TABLET | Freq: Three times a day (TID) | ORAL | Status: DC | PRN

## 2015-11-29 MED ORDER — ONDANSETRON HCL 4 MG/2ML IJ SOLN
INTRAMUSCULAR | Status: AC
  Filled 2015-11-29: qty 2

## 2015-11-29 MED ORDER — METHOCARBAMOL 500 MG PO TABS
500.0000 mg | ORAL_TABLET | Freq: Four times a day (QID) | ORAL | Status: DC | PRN
  Administered 2015-11-29 – 2015-11-30 (×4): 500 mg via ORAL
  Filled 2015-11-29 (×3): qty 1

## 2015-11-29 MED ORDER — PROPOFOL 10 MG/ML IV BOLUS
INTRAVENOUS | Status: AC
  Filled 2015-11-29: qty 20

## 2015-11-29 MED ORDER — OXYCODONE HCL 5 MG PO TABS
5.0000 mg | ORAL_TABLET | ORAL | Status: DC | PRN
Start: 2015-11-29 — End: 2015-11-30
  Administered 2015-11-29 – 2015-11-30 (×7): 10 mg via ORAL
  Filled 2015-11-29 (×6): qty 2

## 2015-11-29 MED ORDER — ONE-DAILY MULTI VITAMINS PO TABS: 1.0000 | ORAL_TABLET | Freq: Every day | ORAL | Status: DC | PRN

## 2015-11-29 MED ORDER — ONDANSETRON HCL 4 MG/2ML IJ SOLN
INTRAMUSCULAR | Status: DC | PRN
  Administered 2015-11-29: 4 mg via INTRAVENOUS

## 2015-11-29 MED ORDER — MIDAZOLAM HCL 2 MG/2ML IJ SOLN
INTRAMUSCULAR | Status: DC | PRN
  Administered 2015-11-29 (×2): 1 mg via INTRAVENOUS

## 2015-11-29 MED ORDER — DEXAMETHASONE SODIUM PHOSPHATE 10 MG/ML IJ SOLN
INTRAMUSCULAR | Status: AC
  Filled 2015-11-29: qty 1

## 2015-11-29 MED ORDER — SENNA-DOCUSATE SODIUM 8.6-50 MG PO TABS: 2.0000 | ORAL_TABLET | Freq: Every day | ORAL | Status: DC

## 2015-11-29 MED ORDER — ACETAMINOPHEN 325 MG PO TABS: 650.0000 mg | ORAL_TABLET | Freq: Four times a day (QID) | ORAL | Status: DC | PRN

## 2015-11-29 MED ORDER — OXYCODONE-ACETAMINOPHEN 10-325 MG PO TABS: 1.0000 | ORAL_TABLET | Freq: Four times a day (QID) | ORAL | Status: DC | PRN

## 2015-11-29 SURGICAL SUPPLY — 97 items
APL SKNCLS STERI-STRIP NONHPOA (GAUZE/BANDAGES/DRESSINGS) ×2
BANDAGE ELASTIC 6 VELCRO ST LF (GAUZE/BANDAGES/DRESSINGS) ×4 IMPLANT
BANDAGE ESMARK 6X9 LF (GAUZE/BANDAGES/DRESSINGS) ×2 IMPLANT
BENZOIN TINCTURE PRP APPL 2/3 (GAUZE/BANDAGES/DRESSINGS) ×4 IMPLANT
BLADE CUTTER GATOR 3.5 (BLADE) IMPLANT
BLADE SAG 18X100X1.27 (BLADE) IMPLANT
BLADE SAW RECIP 87.9 MT (BLADE) IMPLANT
BLADE SAW SGTL 13X75X1.27 (BLADE) ×1 IMPLANT
BLADE SURG 11 STRL SS (BLADE) IMPLANT
BLADE SURG 15 STRL LF DISP TIS (BLADE) ×1 IMPLANT
BLADE SURG 15 STRL SS (BLADE) ×4
BLADE SURG ROTATE 9660 (MISCELLANEOUS) IMPLANT
BNDG CMPR 9X6 STRL LF SNTH (GAUZE/BANDAGES/DRESSINGS) ×2
BNDG ESMARK 6X9 LF (GAUZE/BANDAGES/DRESSINGS) ×4
BOOTCOVER CLEANROOM LRG (PROTECTIVE WEAR) IMPLANT
BOWL SMART MIX CTS (DISPOSABLE) ×4 IMPLANT
CAPT KNEE PARTIAL 2 ×4 IMPLANT
CEMENT HV SMART SET (Cement) ×5 IMPLANT
CLOSURE STERI-STRIP 1/2X4 (GAUZE/BANDAGES/DRESSINGS) ×1
CLSR STERI-STRIP ANTIMIC 1/2X4 (GAUZE/BANDAGES/DRESSINGS) ×3 IMPLANT
COVER SURGICAL LIGHT HANDLE (MISCELLANEOUS) ×4 IMPLANT
CUFF TOURNIQUET SINGLE 34IN LL (TOURNIQUET CUFF) ×4 IMPLANT
CUTTER MENISCUS 3.5MM 6/BX (BLADE) IMPLANT
DRAPE ARTHROSCOPY W/POUCH 114 (DRAPES) ×4 IMPLANT
DRAPE EXTREMITY T 121X128X90 (DRAPE) ×4 IMPLANT
DRAPE IMP U-DRAPE 54X76 (DRAPES) ×4 IMPLANT
DRAPE ORTHO SPLIT 77X108 STRL (DRAPES)
DRAPE PROXIMA HALF (DRAPES) IMPLANT
DRAPE SURG ORHT 6 SPLT 77X108 (DRAPES) IMPLANT
DRAPE U-SHAPE 47X51 STRL (DRAPES) ×4 IMPLANT
DRSG PAD ABDOMINAL 8X10 ST (GAUZE/BANDAGES/DRESSINGS) IMPLANT
DURAPREP 26ML APPLICATOR (WOUND CARE) ×4 IMPLANT
ELECT CAUTERY BLADE 6.4 (BLADE) ×4 IMPLANT
ELECT REM PT RETURN 9FT ADLT (ELECTROSURGICAL) ×4
ELECTRODE REM PT RTRN 9FT ADLT (ELECTROSURGICAL) ×2 IMPLANT
FACESHIELD STD STERILE (MASK) IMPLANT
GAUZE SPONGE 4X4 12PLY STRL (GAUZE/BANDAGES/DRESSINGS) ×4 IMPLANT
GAUZE XEROFORM 1X8 LF (GAUZE/BANDAGES/DRESSINGS) IMPLANT
GLOVE BIO SURGEON STRL SZ7.5 (GLOVE) ×4 IMPLANT
GLOVE BIOGEL PI IND STRL 8 (GLOVE) ×2 IMPLANT
GLOVE BIOGEL PI INDICATOR 8 (GLOVE) ×2
GLOVE BIOGEL PI ORTHO PRO SZ8 (GLOVE) ×2
GLOVE ORTHO TXT STRL SZ7.5 (GLOVE) ×4 IMPLANT
GLOVE PI ORTHO PRO STRL SZ8 (GLOVE) ×2 IMPLANT
GLOVE SURG ORTHO 8.0 STRL STRW (GLOVE) ×8 IMPLANT
GOWN STRL REUS W/ TWL LRG LVL3 (GOWN DISPOSABLE) ×4 IMPLANT
GOWN STRL REUS W/ TWL XL LVL3 (GOWN DISPOSABLE) ×4 IMPLANT
GOWN STRL REUS W/TWL 2XL LVL3 (GOWN DISPOSABLE) ×4 IMPLANT
GOWN STRL REUS W/TWL LRG LVL3 (GOWN DISPOSABLE) ×8
GOWN STRL REUS W/TWL XL LVL3 (GOWN DISPOSABLE) ×8
HANDPIECE INTERPULSE COAX TIP (DISPOSABLE) ×4
HOOD PEEL AWAY FACE SHEILD DIS (HOOD) ×15 IMPLANT
IMMOBILIZER KNEE 22 (SOFTGOODS) ×4 IMPLANT
IMMOBILIZER KNEE 22 UNIV (SOFTGOODS) ×4 IMPLANT
KIT BASIN OR (CUSTOM PROCEDURE TRAY) ×4 IMPLANT
KIT ROOM TURNOVER OR (KITS) ×4 IMPLANT
MANIFOLD NEPTUNE II (INSTRUMENTS) ×4 IMPLANT
NDL HYPO 21X1.5 SAFETY (NEEDLE) IMPLANT
NEEDLE 18GX1X1/2 (RX/OR ONLY) (NEEDLE) IMPLANT
NEEDLE 22X1 1/2 (OR ONLY) (NEEDLE) IMPLANT
NEEDLE HYPO 21X1.5 SAFETY (NEEDLE) IMPLANT
NEEDLE SPNL 18GX3.5 QUINCKE PK (NEEDLE) ×4 IMPLANT
NS IRRIG 1000ML POUR BTL (IV SOLUTION) ×4 IMPLANT
PACK ARTHROSCOPY DSU (CUSTOM PROCEDURE TRAY) ×4 IMPLANT
PACK BLADE SAW RECIP 70 3 PT (BLADE) ×8 IMPLANT
PACK TOTAL JOINT (CUSTOM PROCEDURE TRAY) ×4 IMPLANT
PACK UNIVERSAL I (CUSTOM PROCEDURE TRAY) ×1 IMPLANT
PAD ABD 8X10 STRL (GAUZE/BANDAGES/DRESSINGS) ×4 IMPLANT
PAD ARMBOARD 7.5X6 YLW CONV (MISCELLANEOUS) ×8 IMPLANT
PAD CAST 4YDX4 CTTN HI CHSV (CAST SUPPLIES) ×2 IMPLANT
PADDING CAST COTTON 4X4 STRL (CAST SUPPLIES) ×4
PADDING CAST COTTON 6X4 STRL (CAST SUPPLIES) ×4 IMPLANT
SET ARTHROSCOPY TUBING (MISCELLANEOUS) ×4
SET ARTHROSCOPY TUBING LN (MISCELLANEOUS) ×2 IMPLANT
SET HNDPC FAN SPRY TIP SCT (DISPOSABLE) ×2 IMPLANT
SPONGE LAP 4X18 X RAY DECT (DISPOSABLE) IMPLANT
SUCTION FRAZIER HANDLE 10FR (MISCELLANEOUS) ×2
SUCTION TUBE FRAZIER 10FR DISP (MISCELLANEOUS) ×2 IMPLANT
SUT MNCRL AB 4-0 PS2 18 (SUTURE) ×4 IMPLANT
SUT VIC AB 0 CT1 27 (SUTURE) ×4
SUT VIC AB 0 CT1 27XBRD ANBCTR (SUTURE) ×2 IMPLANT
SUT VIC AB 1 CT1 27 (SUTURE) ×4
SUT VIC AB 1 CT1 27XBRD ANBCTR (SUTURE) ×2 IMPLANT
SUT VIC AB 2-0 CT1 27 (SUTURE) ×4
SUT VIC AB 2-0 CT1 TAPERPNT 27 (SUTURE) ×2 IMPLANT
SUT VIC AB 3-0 SH 8-18 (SUTURE) ×8 IMPLANT
SYR 20ML ECCENTRIC (SYRINGE) IMPLANT
SYR 30ML LL (SYRINGE) IMPLANT
SYR 50ML LL SCALE MARK (SYRINGE) IMPLANT
SYR CONTROL 10ML LL (SYRINGE) IMPLANT
TOWEL OR 17X24 6PK STRL BLUE (TOWEL DISPOSABLE) ×4 IMPLANT
TOWEL OR 17X26 10 PK STRL BLUE (TOWEL DISPOSABLE) ×4 IMPLANT
TRAY CATH 16FR W/PLASTIC CATH (SET/KITS/TRAYS/PACK) IMPLANT
TUBE CONNECTING 12'X1/4 (SUCTIONS) ×1
TUBE CONNECTING 12X1/4 (SUCTIONS) ×3 IMPLANT
WAND HAND CNTRL MULTIVAC 90 (MISCELLANEOUS) IMPLANT
WATER STERILE IRR 1000ML POUR (IV SOLUTION) ×8 IMPLANT

## 2015-11-29 NOTE — Anesthesia Preprocedure Evaluation (Addendum)
Anesthesia Evaluation  Patient identified by MRN, date of birth, ID band Patient awake    Reviewed: Allergy & Precautions, H&P , NPO status , Patient's Chart, lab work & pertinent test results  History of Anesthesia Complications Negative for: history of anesthetic complications  Airway Mallampati: II  TM Distance: >3 FB Neck ROM: full    Dental no notable dental hx. (+) Teeth Intact, Dental Advisory Given   Pulmonary asthma ,    Pulmonary exam normal breath sounds clear to auscultation       Cardiovascular negative cardio ROS Normal cardiovascular exam Rhythm:regular Rate:Normal     Neuro/Psych  Headaches,    GI/Hepatic negative GI ROS, (+) Hepatitis -, C  Endo/Other  negative endocrine ROS  Renal/GU negative Renal ROS     Musculoskeletal  (+) Arthritis ,   Abdominal   Peds  Hematology negative hematology ROS (+)   Anesthesia Other Findings   Reproductive/Obstetrics negative OB ROS                            Anesthesia Physical Anesthesia Plan  ASA: III  Anesthesia Plan: Spinal   Post-op Pain Management:    Induction: Intravenous  Airway Management Planned:   Additional Equipment:   Intra-op Plan:   Post-operative Plan:   Informed Consent: I have reviewed the patients History and Physical, chart, labs and discussed the procedure including the risks, benefits and alternatives for the proposed anesthesia with the patient or authorized representative who has indicated his/her understanding and acceptance.   Dental Advisory Given  Plan Discussed with: Anesthesiologist, CRNA and Surgeon  Anesthesia Plan Comments:         Anesthesia Quick Evaluation

## 2015-11-29 NOTE — Anesthesia Postprocedure Evaluation (Signed)
Anesthesia Post Note  Patient: Mary [name]  Procedure(s) Performed: Procedure(s) (LRB): LEFT KNEE ARTHROSCOPY CHONDROPLASTY  (Left) LEFT UNICOMPARTMENTAL KNEE ARTHROPLASTY (Left)  Patient location during evaluation: PACU Anesthesia Type: General Level of consciousness: awake and alert Pain management: pain level controlled Vital Signs Assessment: post-procedure vital signs reviewed and stable Respiratory status: spontaneous breathing, nonlabored ventilation, respiratory function stable and patient connected to nasal cannula oxygen Cardiovascular status: blood pressure returned to baseline and stable Postop Assessment: no signs of nausea or vomiting Anesthetic complications: no    Last Vitals:  Filed Vitals:   11/29/15 1315 11/29/15 1330  BP: 144/94 148/95  Pulse: 74 75  Temp:    Resp:  16    Last Pain: There were no vitals filed for this visit.               Mary [name], Mary [name]

## 2015-11-29 NOTE — H&P (Signed)
PREOPERATIVE H&P  Chief Complaint: Left knee pain  HPI: Mary [name] is a 65 y.o. female who presents for preoperative history and physical with a diagnosis of left knee osteoarthritis . Symptoms are rated as moderate to severe, and have been worsening.  This is significantly impairing activities of daily living.  She has elected for surgical management.   She has failed injections, activity modification, anti-inflammatories, and assistive devices.  Preoperative X-rays demonstrate moderate degenerative changes, primarily in the medial compartment, there is some osteophyte formation as well in the patellofemoral region as well as to some degree on the lateral side, preoperative MRI also demonstrates evidence for degenerative changes that seem to be more localized medially, although there is questionable involvement of multiple compartments. She has failed extensive conservative measures, and wishes for arthroscopic evaluation and definitive management of her arthritic process. It is not yet clear if she will require a partial knee replacement or a total knee replacement.   Past Medical History  Diagnosis Date  . Seasonal allergies     takes Zyrtec daily and uses Flonase  . Complication of anesthesia     slow to wake up  . Asthma     has a rescue inhaler  . Pneumonia     last time about 2 yrs ago  . History of bronchitis 2016  . Headache     "mild migraine"  . Arthritis   . Joint pain   . Joint swelling   . Diverticulosis   . Anemia     as a child  . History of blood transfusion 1979    Hep C  . Hepatitis     C from Blood transfusion in 1979   Past Surgical History  Procedure Laterality Date  . Colonoscopy    . Right knee arthroscopy Right   . Colonoscopy N/A 11/02/2013    Procedure: COLONOSCOPY;  Surgeon: Corbin Ade, MD;  Location: [facility];  Service: Endoscopy;  Laterality: N/A;  10:30 AM  . Tubal ligation    . Refractive surgery    . Esophagogastroduodenoscopy      Social History   Social History  . Marital Status: Married    Spouse Name: N/A  . Number of Children: N/A  . Years of Education: N/A   Social History Main Topics  . Smoking status: Never Smoker   . Smokeless tobacco: None  . Alcohol Use: Yes     Comment: occasionally  . Drug Use: No  . Sexual Activity: Yes   Other Topics Concern  . None   Social History Narrative   Family History  Problem Relation Age of Onset  . Colon cancer Neg Hx    Allergies  Allergen Reactions  . Penicillins Hives and Swelling    Has patient had a PCN reaction causing immediate rash, facial/tongue/throat swelling, SOB or lightheadedness with hypotension: No  Has patient had a PCN reaction causing severe rash involving mucus membranes or skin  necrosis: No Has patient had a PCN reaction that required hospitalization No Has patient had a PCN reaction occurring within the last 10 years: No If all of the above answers are "NO", then may proceed with Cephalosporin use.  *Patient stated that she recalls feet swelling ONLY*  . Shellfish Allergy Diarrhea    Severe    Prior to Admission medications   Medication Sig Start Date End Date Taking? Authorizing Provider  albuterol (PROVENTIL HFA;VENTOLIN HFA) 108 (90 Base) MCG/ACT inhaler Inhale 2 puffs into the lungs every  6 (six) hours as needed for wheezing or shortness of breath.   Yes Historical Provider, MD  Calcium Carb-Cholecalciferol (CALCIUM 600 + D PO) Take 1 tablet by mouth daily as needed (for low calcium).    Yes Historical Provider, MD  cetirizine (ZYRTEC) 10 MG tablet Take 10 mg by mouth daily.   Yes Historical Provider, MD  fluticasone (FLONASE) 50 MCG/ACT nasal spray Place 1 spray into both nostrils daily.    Yes Historical Provider, MD  ibuprofen (ADVIL,MOTRIN) 200 MG tablet Take 400 mg by mouth daily as needed for moderate pain.    Yes Historical Provider, MD  Multiple Vitamin (MULTIVITAMIN) tablet Take 1 tablet by mouth daily as needed  (for supplementation).    Yes Historical Provider, MD  naproxen (NAPROSYN) 500 MG tablet Take 500 mg by mouth 2 (two) times daily as needed for mild pain.  10/02/15  Yes Historical Provider, MD  vitamin C (ASCORBIC ACID) 250 MG tablet Take 250 mg by mouth daily as needed (for immune support).    Yes Historical Provider, MD     Positive ROS: All other systems have been reviewed and were otherwise negative with the exception of those mentioned in the HPI and as above.  Physical Exam: General: Alert, no acute distress Cardiovascular: No pedal edema Respiratory: No cyanosis, no use of accessory musculature GI: No organomegaly, abdomen is soft and non-tender Skin: No lesions in the area of chief complaint Neurologic: Sensation intact distally Psychiatric: Patient is competent for consent with normal mood and affect Lymphatic: No axillary or cervical lymphadenopathy  MUSCULOSKELETAL: Left knee has positive medial joint line tenderness with moderate crepitance, painful arc of motion, 0-120 with intact stability to varus valgus and Lachman maneuver.  Assessment: Left knee pain with moderate degenerative changes, particularly in the medial compartment but also involving multiple other compartments.   Plan: Plan for Procedure(s): Left knee arthroscopy with diagnostic and potentially therapeutic intervention, probable partial knee replacement versus total knee replacement depending on intraoperative findings.  The risks benefits and alternatives were discussed with the patient including but not limited to the risks of nonoperative treatment, versus surgical intervention including infection, bleeding, nerve injury,  blood clots, cardiopulmonary complications, morbidity, mortality, among others, and they were willing to proceed.   Eulas PostLANDAU,Jenella Craigie P, MD Cell [phone]   11/29/2015 9:32 AM

## 2015-11-29 NOTE — Op Note (Signed)
11/29/2015  12:21 PM  PATIENT:  Mary [name]    PRE-OPERATIVE DIAGNOSIS:  Left knee osteoarthritis  POST-OPERATIVE DIAGNOSIS:  Left knee primary localized osteoarthritis involving primarily the medial compartment, to a significant lesser degree the patellofemoral joint and lateral compartment  PROCEDURE:  Left knee arthroscopy with chondroplasty of the patellofemoral joint, with open unicompartmental left medial knee replacement  SURGEON:  Eulas PostLANDAU,Cledis Sohn P, MD  PHYSICIAN ASSISTANT: Janace LittenBrandon Parry, OPA-C, present and scrubbed throughout the case, critical for completion in a timely fashion, and for retraction, instrumentation, and closure.  ANESTHESIA:   General  PREOPERATIVE INDICATIONS:  Mary [name] is a  65 y.o. female  who had significant left sided medial knee pain that failed conservative measures and elected for surgical management.  The risks benefits and alternatives were discussed with the patient preoperatively including but not limited to the risks of infection, bleeding, nerve injury, cardiopulmonary complications, blood clots, the need for revision surgery, among others, and the patient was willing to proceed.  OPERATIVE IMPLANTS: Biomet [employer] mobile bearing medial compartment arthroplasty femur size medium, tibia size A, bearing size 4.  OPERATIVE FINDINGS: Endstage grade 4 medial compartment osteoarthritis. The anterior cruciate ligament was intact. The PCL was intact. The lateral compartment had some chondral thinning on the tibia, although the femur appeared reasonably well-preserved, the femoral trochlea had some extensive grade grade 2 and a small area of grade 3 changes, and there was also some grade 2 and grade 3 changes under the patella, there was significant rimming osteophytes along the lateral femoral condyle and around the patella, medial compartment had extensive grade 4 changes. The patella was extremely hypermobile, and easily subluxated laterally.   Unique  aspects of the case: I debated intraoperatively extensively regarding unicompartmental replacement versus total knee replacement, and felt that there was enough cartilage preserved patellofemoral joint and lateral compartment, in combination with the fact that her symptoms were primarily medial, that the improved functional outcome of unicompartmental knee replacement outweighed the risk of potential progression of arthritis in other compartments.  Also, the tibia was fairly small, and in fact the footed jig placed an indention on the undersurface of the anterior cruciate ligament spine, although the spine remained stable and intact. Bone quality was somewhat mediocre.  OPERATIVE PROCEDURE: The patient was brought to the operating room placed in supine position. Spinal anesthesia was administered, but was not sufficient, and so she was converted to General anesthesia. IV antibiotics were given in the form of Ancef with no reaction. The lower extremity was placed in the legholder and prepped and draped in usual sterile fashion.  Time out was performed.  The leg was elevated and exsanguinated and the tourniquet was inflated. I began with the diagnostic arthroscopy, which demonstrated the above-named findings. I did use the arthroscopic shaver to perform a light chondroplasty on the patellofemoral joint. At the completion of the arthroscopy, I elected to proceed with the open portion of the case, with the decision to proceed with partial knee replacement.  Anteromedial incision was performed, and I took care to preserve the MCL. Parapatellar incision was carried out, and the osteophytes were excised, along with the medial meniscus and a small portion of the fat pad.  The extra medullary tibial cutting jig was applied, using the spoon and the 4mm G-Clamp and the 2 mm shim, and I took care to protect the anterior cruciate ligament insertion and the tibial spine. The medial collateral ligament was also  protected, and I resected my proximal  tibia, matching the anatomic slope.   The proximal tibial bony cut was removed in one piece, and I turned my attention to the femur.  The intramedullary femoral rod was placed using the drill, and then using the appropriate reference, I assembled the femoral jig, setting my posterior cutting block. I resected my posterior femur, used the 0 spigot for the anterior femur, and then measured my gap.   I then used the appropriate mill to match the extension gap to the flexion gap. The second milling was at a 3 and then again at a 4.  The gaps were then measured again with the appropriate feeler gauges. Once I had balanced flexion and extension gaps, I then completed the preparation of the femur.  I milled off the anterior aspect of the distal femur to prevent impingement. I also exposed the tibia, and selected the above-named component, and then used the cutting jig to prepare the keel slot on the tibia. I also used the awl to curette out the bone to complete the preparation of the keel. The back wall was intact.  I then placed trial components, and it was found to have excellent motion, and appropriate balance.  I then cemented the components into place, cementing the tibia first, removing all excess cement, and then cementing the femur.  All loose cement was removed.  The real polyethylene insert was applied manually, and the knee was taken through functional range of motion, and found to have excellent stability and restoration of joint motion, with excellent balance.  The wounds were irrigated copiously, and the parapatellar tissue closed with Vicryl, followed by Vicryl for the subcutaneous tissue, with routine closure with Steri-Strips and sterile gauze.  The tourniquet was released, and the patient was awakened and extubated and returned to PACU in stable and satisfactory condition. There were no complications.

## 2015-11-29 NOTE — Anesthesia Procedure Notes (Addendum)
Spinal Patient location during procedure: OR Staffing Anesthesiologist: Quaniya Damas Performed by: anesthesiologist  Preanesthetic Checklist Completed: patient identified, site marked, surgical consent, pre-op evaluation, timeout performed, IV checked, risks and benefits discussed and monitors and equipment checked Spinal Block Patient position: sitting Prep: DuraPrep Patient monitoring: heart rate, cardiac monitor, continuous pulse ox and blood pressure Approach: midline Location: L4-5 Injection technique: single-shot Needle Needle type: Pencan  Needle gauge: 24 G Additional Notes Functioning IV was confirmed and monitors were applied. Sterile prep and drape, including hand hygiene, mask and sterile gloves were used. The patient was positioned and the spine was prepped. The skin was anesthetized with lidocaine.  Free flow of clear CSF was obtained prior to injecting local anesthetic into the CSF.  The spinal needle aspirated freely following injection.  The needle was carefully withdrawn.  The patient tolerated the procedure well. Consent was obtained prior to procedure with all questions answered and concerns addressed.  Blima DessertBen Mahati Vajda, MD  Procedure Name: LMA Insertion Date/Time: 11/29/2015 10:07 AM Performed by: Reine JustFLOWERS, ROKOSHI T Pre-anesthesia Checklist: Patient identified, Emergency Drugs available, Suction available, Patient being monitored and Timeout performed Patient Re-evaluated:Patient Re-evaluated prior to inductionOxygen Delivery Method: Circle system utilized and Simple face mask Preoxygenation: Pre-oxygenation with 100% oxygen Intubation Type: IV induction Ventilation: Mask ventilation without difficulty LMA: LMA inserted LMA Size: 5.0 Number of attempts: 1 Airway Equipment and Method: Patient positioned with wedge pillow Placement Confirmation: positive ETCO2 and breath sounds checked- equal and bilateral Tube secured with: Tape Dental Injury: Teeth and Oropharynx as  per pre-operative assessment

## 2015-11-29 NOTE — Transfer of Care (Signed)
Immediate Anesthesia Transfer of Care Note  Patient: Mary [name]  Procedure(s) Performed: Procedure(s): LEFT KNEE ARTHROSCOPY CHONDROPLASTY  (Left) LEFT UNICOMPARTMENTAL KNEE ARTHROPLASTY (Left)  Patient Location: PACU  Anesthesia Type:General  Level of Consciousness: awake and alert   Airway & Oxygen Therapy: Patient Spontanous Breathing and Patient connected to nasal cannula oxygen  Post-op Assessment: Report given to RN, Post -op Vital signs reviewed and stable and Patient moving all extremities X 4  Post vital signs: Reviewed and stable  Last Vitals:  Filed Vitals:   11/29/15 0823  BP: 150/94  Pulse: 66  Temp: 37.1 C  Resp: 20    Last Pain: There were no vitals filed for this visit.       Complications: No apparent anesthesia complications

## 2015-11-30 ENCOUNTER — Encounter (HOSPITAL_COMMUNITY): Payer: Self-pay | Admitting: General Practice

## 2015-11-30 DIAGNOSIS — M1712 Unilateral primary osteoarthritis, left knee: Secondary | ICD-10-CM | POA: Diagnosis not present

## 2015-11-30 LAB — BASIC METABOLIC PANEL
ANION GAP: 5 (ref 5–15)
BUN: 10 mg/dL (ref 6–20)
CHLORIDE: 103 mmol/L (ref 101–111)
CO2: 25 mmol/L (ref 22–32)
Calcium: 8.7 mg/dL — ABNORMAL LOW (ref 8.9–10.3)
Creatinine, Ser: 0.9 mg/dL (ref 0.44–1.00)
GFR calc Af Amer: >60 mL/min (ref >60)
GLUCOSE: 120 mg/dL — AB (ref 65–99)
POTASSIUM: 4.5 mmol/L (ref 3.5–5.1)
Sodium: 133 mmol/L — ABNORMAL LOW (ref 135–145)

## 2015-11-30 LAB — CBC
HEMATOCRIT: 34.8 % — AB (ref 36.0–46.0)
HEMOGLOBIN: 11.5 g/dL — AB (ref 12.0–15.0)
MCH: 28.5 pg (ref 26.0–34.0)
MCHC: 33 g/dL (ref 30.0–36.0)
MCV: 86.4 fL (ref 78.0–100.0)
PLATELETS: 230 10*3/uL (ref 150–400)
RBC: 4.03 MIL/uL (ref 3.87–5.11)
RDW: 13 % (ref 11.5–15.5)
WBC: 11.9 10*3/uL — AB (ref 4.0–10.5)

## 2015-11-30 NOTE — Progress Notes (Signed)
Physical Therapy Treatment Patient Details Name: Mary RossSandra K [name] MRN: [id number] DOB: 01/09/1951 Today's Date: 11/30/2015    History of Present Illness Pt is a 65 y/o female who presents s/p L unicompartmental knee replacement on 11/29/15.     PT Comments    Pt progressing well towards physical therapy goals. Was able to perform transfers and ambulation with modified independence to supervision for safety. Instructed pt and husband in stair training and with HHA was able to negotiate 5 steps well. Pt is safe for d/c from a mobility standpoint, and pt/husband report desire to return home this afternoon. Will continue to follow until d/c.   Follow Up Recommendations  Home health PT;Supervision for mobility/OOB     Equipment Recommendations  None recommended by PT    Recommendations for Other Services       Precautions / Restrictions Precautions Precautions: Fall;Knee Precaution Booklet Issued: Yes (comment) Precaution Comments: Reviewed precautions and pt was issued a HEP handout Restrictions Weight Bearing Restrictions: Yes LLE Weight Bearing: Weight bearing as tolerated    Mobility  Bed Mobility Overal bed mobility: Modified Independent Bed Mobility: Supine to Sit;Sit to Supine       Sit to supine: Supervision   General bed mobility comments: Pt and husband were instructed in different ways to enter the bed as it is high at home. Bed was elevated to simulate home environment. No assist required, and pt was able to complete without rails for support.   Transfers Overall transfer level: Modified independent Equipment used: Rolling walker (2 wheeled) Transfers: Sit to/from Stand Sit to Stand: Min guard         General transfer comment: No assist required, and no unsteadiness noted. Good demonstration of proper hand placement on seated surface for safety.   Ambulation/Gait Ambulation/Gait assistance: Supervision Ambulation Distance (Feet): 200 Feet Assistive device:  Rolling walker (2 wheeled) Gait Pattern/deviations: Step-through pattern;Decreased stride length;Decreased dorsiflexion - left;Decreased weight shift to left Gait velocity: Decreased Gait velocity interpretation: Below normal speed for age/gender General Gait Details: VC's for improved posture, increased heel strike, and increased gait speed. Pt was able to make corrective changes and min cues were required for maintenance of the changes.    Stairs Stairs: Yes Stairs assistance: Min guard Stair Management: One rail Right;Step to pattern;Forwards Number of Stairs: 1 (x5) General stair comments: HHA on the L with railing held on the R. Pt was instructed in proper sequencing for stair negotiation. No significant assistance required - grossly min guard.   Wheelchair Mobility    Modified Rankin (Stroke Patients Only)       Balance Overall balance assessment: No apparent balance deficits (not formally assessed)                                  Cognition Arousal/Alertness: Awake/alert Behavior During Therapy: WFL for tasks assessed/performed Overall Cognitive Status: Within Functional Limits for tasks assessed                      Exercises Total Joint Exercises Quad Sets: 15 reps Heel Slides: 20 reps Hip ABduction/ADduction: 10 reps Straight Leg Raises: 10 reps Goniometric ROM: 90 seated with AAROM for end range    General Comments        Pertinent Vitals/Pain Pain Assessment: Faces Pain Score: 6  Faces Pain Scale: Hurts a little bit Pain Location: L knee Pain Descriptors / Indicators: Operative site guarding;Sore Pain  Intervention(s): Limited activity within patient's tolerance;Monitored during session;Repositioned    Home Living Family/patient expects to be discharged to:: Private residence Living Arrangements: Spouse/significant other Available Help at Discharge: Family;Available 24 hours/day Type of Home: House Home Access: Stairs to  enter Entrance Stairs-Rails: Left Home Layout: One level Home Equipment: Walker - 2 wheels;Cane - single point;Shower seat;Grab bars - tub/shower;Bedside commode      Prior Function Level of Independence: Independent          PT Goals (current goals can now be found in the care plan section) Acute Rehab PT Goals Patient Stated Goal: Home this afternoon PT Goal Formulation: With patient/family Time For Goal Achievement: 12/07/15 Potential to Achieve Goals: Good Progress towards PT goals: Progressing toward goals    Frequency  7X/week    PT Plan Current plan remains appropriate    Co-evaluation             End of Session Equipment Utilized During Treatment: Gait belt Activity Tolerance: Patient tolerated treatment well Patient left: in bed;with call bell/phone within reach;with family/visitor present     Time: 1335-1411 PT Time Calculation (min) (ACUTE ONLY): 36 min  Charges:  $Gait Training: 23-37 mins                    G Codes:      Conni Slipper 12/20/2015, 2:21 PM   Conni Slipper, PT, DPT Acute Rehabilitation Services Pager: [phone]

## 2015-11-30 NOTE — Progress Notes (Signed)
Pt ready for discharge to home. IV removed.Pt. Is alert and oriented. Pt is hemodynamically stable. AVS reviewed with pt. Capable of re verbalizing medication regimen. Discharge plan appropriate and in place. 

## 2015-11-30 NOTE — Care Management Note (Signed)
Case Management Note  Patient Details  Name: Mary [name] MRN: [id number] Date of Birth: 02/26/1951  Subjective/Objective:                    Action/[name]:  Spoke to patient and husband at bedside . Patient's husband has had both knees replaced in past , so they already have 3 in 1 and walker at home. Expected Discharge Date:                  Expected Discharge [name]:  Home w Home Health Services  In-House Referral:     Discharge planning Services  CM Consult  Post Acute Care Choice:  Durable Medical Equipment, Home Health Choice offered to:  Patient, Spouse  DME Arranged:    DME Agency:     HH Arranged:  [facility]  Status of Service:  Completed, signed off  If discussed at Long Length of Stay Meetings, dates discussed:    Additional Comments:  Mary PlanWile, Kayse Puccini Marie, RN 11/30/2015, 10:40 AM

## 2015-11-30 NOTE — Discharge Instructions (Signed)
INSTRUCTIONS AFTER JOINT REPLACEMENT  ° °o Remove items at home which could result in a fall. This includes throw rugs or furniture in walking pathways °o ICE to the affected joint every three hours while awake for 30 minutes at a time, for at least the first 3-5 days, and then as needed for pain and swelling.  Continue to use ice for pain and swelling. You may notice swelling that will progress down to the foot and ankle.  This is normal after surgery.  Elevate your leg when you are not up walking on it.   °o Continue to use the breathing machine you got in the hospital (incentive spirometer) which will help keep your temperature down.  It is common for your temperature to cycle up and down following surgery, especially at night when you are not up moving around and exerting yourself.  The breathing machine keeps your lungs expanded and your temperature down. ° ° °DIET:  As you were doing prior to hospitalization, we recommend a well-balanced diet. ° °DRESSING / WOUND CARE / SHOWERING ° °You may change your dressing 3-5 days after surgery.  Then change the dressing every day with sterile gauze.  Please use good hand washing techniques before changing the dressing.  Do not use any lotions or creams on the incision until instructed by your surgeon. ° °ACTIVITY ° °o Increase activity slowly as tolerated, but follow the weight bearing instructions below.   °o No driving for 6 weeks or until further direction given by your physician.  You cannot drive while taking narcotics.  °o No lifting or carrying greater than 10 lbs. until further directed by your surgeon. °o Avoid periods of inactivity such as sitting longer than an hour when not asleep. This helps prevent blood clots.  °o You may return to work once you are authorized by your doctor.  ° ° ° °WEIGHT BEARING  ° °Weight bearing as tolerated with assist device (walker, cane, etc) as directed, use it as long as suggested by your surgeon or therapist, typically at  least 4-6 weeks. ° ° °EXERCISES ° °Results after joint replacement surgery are often greatly improved when you follow the exercise, range of motion and muscle strengthening exercises prescribed by your doctor. Safety measures are also important to protect the joint from further injury. Any time any of these exercises cause you to have increased pain or swelling, decrease what you are doing until you are comfortable again and then slowly increase them. If you have problems or questions, call your caregiver or physical therapist for advice.  ° °Rehabilitation is important following a joint replacement. After just a few days of immobilization, the muscles of the leg can become weakened and shrink (atrophy).  These exercises are designed to build up the tone and strength of the thigh and leg muscles and to improve motion. Often times heat used for twenty to thirty minutes before working out will loosen up your tissues and help with improving the range of motion but do not use heat for the first two weeks following surgery (sometimes heat can increase post-operative swelling).  ° °These exercises can be done on a training (exercise) mat, on the floor, on a table or on a bed. Use whatever works the best and is most comfortable for you.    Use music or television while you are exercising so that the exercises are a pleasant break in your day. This will make your life better with the exercises acting as a break   in your routine that you can look forward to.   Perform all exercises about fifteen times, three times per day or as directed.  You should exercise both the operative leg and the other leg as well. ° °Exercises include: °  °• Quad Sets - Tighten up the muscle on the front of the thigh (Quad) and hold for 5-10 seconds.   °• Straight Leg Raises - With your knee straight (if you were given a brace, keep it on), lift the leg to 60 degrees, hold for 3 seconds, and slowly lower the leg.  Perform this exercise against  resistance later as your leg gets stronger.  °• Leg Slides: Lying on your back, slowly slide your foot toward your buttocks, bending your knee up off the floor (only go as far as is comfortable). Then slowly slide your foot back down until your leg is flat on the floor again.  °• Angel Wings: Lying on your back spread your legs to the side as far apart as you can without causing discomfort.  °• Hamstring Strength:  Lying on your back, push your heel against the floor with your leg straight by tightening up the muscles of your buttocks.  Repeat, but this time bend your knee to a comfortable angle, and push your heel against the floor.  You may put a pillow under the heel to make it more comfortable if necessary.  ° °A rehabilitation program following joint replacement surgery can speed recovery and prevent re-injury in the future due to weakened muscles. Contact your doctor or a physical therapist for more information on knee rehabilitation.  ° ° °CONSTIPATION ° °Constipation is defined medically as fewer than three stools per week and severe constipation as less than one stool per week.  Even if you have a regular bowel pattern at home, your normal regimen is likely to be disrupted due to multiple reasons following surgery.  Combination of anesthesia, postoperative narcotics, change in appetite and fluid intake all can affect your bowels.  ° °YOU MUST use at least one of the following options; they are listed in order of increasing strength to get the job done.  They are all available over the counter, and you may need to use some, POSSIBLY even all of these options:   ° °Drink plenty of fluids (prune juice may be helpful) and high fiber foods °Colace 100 mg by mouth twice a day  °Senokot for constipation as directed and as needed Dulcolax (bisacodyl), take with full glass of water  °Miralax (polyethylene glycol) once or twice a day as needed. ° °If you have tried all these things and are unable to have a bowel  movement in the first 3-4 days after surgery call either your surgeon or your primary doctor.   ° °If you experience loose stools or diarrhea, hold the medications until you stool forms back up.  If your symptoms do not get better within 1 week or if they get worse, check with your doctor.  If you experience "the worst abdominal pain ever" or develop nausea or vomiting, please contact the office immediately for further recommendations for treatment. ° ° °ITCHING:  If you experience itching with your medications, try taking only a single pain pill, or even half a pain pill at a time.  You can also use Benadryl over the counter for itching or also to help with sleep.  ° °TED HOSE STOCKINGS:  Use stockings on both legs until for at least 2 weeks or as   directed by physician office. They may be removed at night for sleeping. ° °MEDICATIONS:  See your medication summary on the “After Visit Summary” that nursing will review with you.  You may have some home medications which will be placed on hold until you complete the course of blood thinner medication.  It is important for you to complete the blood thinner medication as prescribed. ° °PRECAUTIONS:  If you experience chest pain or shortness of breath - call 911 immediately for transfer to the hospital emergency department.  ° °If you develop a fever greater that 101 F, purulent drainage from wound, increased redness or drainage from wound, foul odor from the wound/dressing, or calf pain - CONTACT YOUR SURGEON.   °                                                °FOLLOW-UP APPOINTMENTS:  If you do not already have a post-op appointment, please call the office for an appointment to be seen by your surgeon.  Guidelines for how soon to be seen are listed in your “After Visit Summary”, but are typically between 1-4 weeks after surgery. ° °OTHER INSTRUCTIONS:  ° °Knee Replacement:  Do not place pillow under knee, focus on keeping the knee straight while resting. CPM  instructions: 0-90 degrees, 2 hours in the morning, 2 hours in the afternoon, and 2 hours in the evening. Place foam block, curve side up under heel at all times except when in CPM or when walking.  DO NOT modify, tear, cut, or change the foam block in any way. ° °MAKE SURE YOU:  °• Understand these instructions.  °• Get help right away if you are not doing well or get worse.  ° ° °Thank you for letting us be a part of your medical care team.  It is a privilege we respect greatly.  We hope these instructions will help you stay on track for a fast and full recovery!  ° ° ° °Information on my medicine - XARELTO® (Rivaroxaban) ° °This medication education was reviewed with me or my healthcare representative as part of my discharge preparation.  The pharmacist that spoke with me during my hospital stay was:  Doyce Saling Dien, RPH ° °Why was Xarelto® prescribed for you? °Xarelto® was prescribed for you to reduce the risk of blood clots forming after orthopedic surgery. The medical term for these abnormal blood clots is venous thromboembolism (VTE). ° °What do you need to know about xarelto® ? °Take your Xarelto® ONCE DAILY at the same time every day. °You may take it either with or without food. ° °If you have difficulty swallowing the tablet whole, you may crush it and mix in applesauce just prior to taking your dose. ° °Take Xarelto® exactly as prescribed by your doctor and DO NOT stop taking Xarelto® without talking to the doctor who prescribed the medication.  Stopping without other VTE prevention medication to take the place of Xarelto® may increase your risk of developing a clot. ° °After discharge, you should have regular check-up appointments with your healthcare provider that is prescribing your Xarelto®.   ° °What do you do if you miss a dose? °If you miss a dose, take it as soon as you remember on the same day then continue your regularly scheduled once daily regimen the next day. Do not take two   doses of  Xarelto® on the same day.  ° °Important Safety Information °A possible side effect of Xarelto® is bleeding. You should call your healthcare provider right away if you experience any of the following: °? Bleeding from an injury or your nose that does not stop. °? Unusual colored urine (red or dark brown) or unusual colored stools (red or black). °? Unusual bruising for unknown reasons. °? A serious fall or if you hit your head (even if there is no bleeding). ° °Some medicines may interact with Xarelto® and might increase your risk of bleeding while on Xarelto®. To help avoid this, consult your healthcare provider or pharmacist prior to using any new prescription or non-prescription medications, including herbals, vitamins, non-steroidal anti-inflammatory drugs (NSAIDs) and supplements. ° °This website has more information on Xarelto®: www.xarelto.com. ° ° ° ° °

## 2015-11-30 NOTE — Clinical Social Work Note (Signed)
CSW received referral for SNF.  Case discussed with case manager and plan is to discharge home.  CSW to sign off please re-consult if social work needs arise.  Loretto Belinsky R. Cheney Ewart, MSW, LCSWA [phone]  

## 2015-11-30 NOTE — Discharge Summary (Signed)
Physician Discharge Summary  Patient ID: Mary [name] MRN: [id number] DOB/AGE: 64/07/1950 64 y.o.  Admit date: 11/29/2015 Discharge date: 11/30/2015  Admission Diagnoses:  Primary localized osteoarthritis of left knee  Discharge Diagnoses:  Principal Problem:   Primary localized osteoarthritis of left knee Active Problems:   S/P left unicompartmental knee replacement   Past Medical History  Diagnosis Date  . Seasonal allergies     takes Zyrtec daily and uses Flonase  . Complication of anesthesia     slow to wake up  . Asthma     has a rescue inhaler  . Pneumonia     last time about 2 yrs ago  . History of bronchitis 2016  . Headache     "mild migraine"  . Arthritis   . Joint pain   . Joint swelling   . Diverticulosis   . Anemia     as a child  . History of blood transfusion 1979    Hep C  . Hepatitis     C from Blood transfusion in 1979  . Primary localized osteoarthritis of left knee 11/29/2015    Surgeries: Procedure(s): LEFT KNEE ARTHROSCOPY CHONDROPLASTY  LEFT UNICOMPARTMENTAL KNEE ARTHROPLASTY on 11/29/2015   Consultants (if any):    Discharged Condition: Improved  Hospital Course: Mary [name] is an 65 y.o. female who was admitted 11/29/2015 with a diagnosis of Primary localized osteoarthritis of left knee and went to the operating room on 11/29/2015 and underwent the above named procedures.    She was given perioperative antibiotics:  Anti-infectives    Start     Dose/Rate Route Frequency Ordered Stop   11/29/15 1630  ceFAZolin (ANCEF) IVPB 2g/100 mL premix     2 g 200 mL/hr over 30 Minutes Intravenous Every 6 hours 11/29/15 1510 11/29/15 2220   11/29/15 1000  ceFAZolin (ANCEF) IVPB 2g/100 mL premix     2 g 200 mL/hr over 30 Minutes Intravenous On call to O.R. 11/29/15 0832 11/29/15 1026   11/29/15 0920  ceFAZolin (ANCEF) 2-4 GM/100ML-% IVPB    Comments:  Ray ChurchBowman, Jennifer   : cabinet override      11/29/15 0920 11/29/15 2129    .  She was given  sequential compression devices, early ambulation, and xarelto for DVT prophylaxis.  She benefited maximally from the hospital stay and there were no complications.    Recent vital signs:  Filed Vitals:   11/30/15 0222 11/30/15 0552  BP: 104/67 101/69  Pulse: 71 69  Temp: 97.9 F (36.6 C) 98.1 F (36.7 C)  Resp: 16 18    Recent laboratory studies:  Lab Results  Component Value Date   HGB 11.5* 11/30/2015   HGB 13.8 11/18/2015   HGB 14.0 05/21/2007   Lab Results  Component Value Date   WBC 11.9* 11/30/2015   PLT 230 11/30/2015   No results found for: INR Lab Results  Component Value Date   NA 133* 11/30/2015   K 4.5 11/30/2015   CL 103 11/30/2015   CO2 25 11/30/2015   BUN 10 11/30/2015   CREATININE 0.90 11/30/2015   GLUCOSE 120* 11/30/2015    Discharge Medications:     Medication List    STOP taking these medications        ibuprofen 200 MG tablet  Commonly known as:  ADVIL,MOTRIN     naproxen 500 MG tablet  Commonly known as:  NAPROSYN      TAKE these medications        albuterol  108 (90 Base) MCG/ACT inhaler  Commonly known as:  PROVENTIL HFA;VENTOLIN HFA  Inhale 2 puffs into the lungs every 6 (six) hours as needed for wheezing or shortness of breath.     baclofen 10 MG tablet  Commonly known as:  LIORESAL  Take 1 tablet (10 mg total) by mouth 3 (three) times daily. As needed for muscle spasm     CALCIUM 600 + D PO  Take 1 tablet by mouth daily as needed (for low calcium).     cetirizine 10 MG tablet  Commonly known as:  ZYRTEC  Take 10 mg by mouth daily.     fluticasone 50 MCG/ACT nasal spray  Commonly known as:  FLONASE  Place 1 spray into both nostrils daily.     multivitamin tablet  Take 1 tablet by mouth daily as needed (for supplementation).     ondansetron 4 MG tablet  Commonly known as:  ZOFRAN  Take 1 tablet (4 mg total) by mouth every 8 (eight) hours as needed for nausea or vomiting.     oxyCODONE-acetaminophen 10-325 MG tablet   Commonly known as:  PERCOCET  Take 1-2 tablets by mouth every 6 (six) hours as needed for pain. MAXIMUM TOTAL ACETAMINOPHEN DOSE IS 4000 MG PER DAY     rivaroxaban 10 MG Tabs tablet  Commonly known as:  XARELTO  Take 1 tablet (10 mg total) by mouth daily.     sennosides-docusate sodium 8.6-50 MG tablet  Commonly known as:  SENOKOT-S  Take 2 tablets by mouth daily.     vitamin C 250 MG tablet  Commonly known as:  ASCORBIC ACID  Take 250 mg by mouth daily as needed (for immune support).        Diagnostic Studies: Dg Knee Left Port  11/29/2015  CLINICAL DATA:  Status post left knee arthroscopy. EXAM: PORTABLE LEFT KNEE - 1-2 VIEW COMPARISON:  MR of knee Sep 27, 2015 FINDINGS: Air is identified in the left knee joint consistent with recent arthroscopy. No acute fracture or dislocation identified. Left knee replacement of the medial tibia in medial distal femur is identified without malalignment. IMPRESSION: Recent arthroscopy changes of the left knee. Electronically Signed   By: Sherian Rein M.D.   On: 11/29/2015 16:10    Disposition: 01-Home or Self Care        Follow-up Information    Follow up with Eulas Post, MD. Schedule an appointment as soon as possible for a visit in 2 weeks.   Specialty:  Orthopedic Surgery   Contact information:   [address] [place] [address] [phone]        Signed: Eulas Post 11/30/2015, 9:51 AM

## 2015-11-30 NOTE — Progress Notes (Signed)
Patient ID: Mary RossSandra K [name], female   DOB: 11/19/1950, 65 y.o.   MRN: [id number]     Subjective:  Patient reports pain as mild to moderate.  Patient in bed and waiting on PT.  Denies CP or SOB  Objective:   VITALS:   Filed Vitals:   11/29/15 1514 11/29/15 2124 11/30/15 0222 11/30/15 0552  BP: 138/85 120/74 104/67 101/69  Pulse: 81 78 71 69  Temp: 97.9 F (36.6 C) 98.6 F (37 C) 97.9 F (36.6 C) 98.1 F (36.7 C)  TempSrc: Oral Oral Oral Oral  Resp: 19 17 16 18   Height:      Weight:      SpO2: 98% 97% 98% 96%    ABD soft Sensation intact distally Dorsiflexion/Plantar flexion intact Incision: dressing C/D/I and no drainage Good foot and ankle motion  Lab Results  Component Value Date   WBC 11.9* 11/30/2015   HGB 11.5* 11/30/2015   HCT 34.8* 11/30/2015   MCV 86.4 11/30/2015   PLT 230 11/30/2015   BMET    Component Value Date/Time   NA 133* 11/30/2015 0603   K 4.5 11/30/2015 0603   CL 103 11/30/2015 0603   CO2 25 11/30/2015 0603   GLUCOSE 120* 11/30/2015 0603   BUN 10 11/30/2015 0603   CREATININE 0.90 11/30/2015 0603   CALCIUM 8.7* 11/30/2015 0603   GFRNONAA >60 11/30/2015 0603   GFRAA >60 11/30/2015 0603     Assessment/Plan: 1 Day Post-Op   Principal Problem:   Primary localized osteoarthritis of left knee Active Problems:   S/P left unicompartmental knee replacement   Advance diet Up with therapy WBAT Dry dressing PRN Plan DC tomorrow or later today.   Haskel KhanDOUGLAS PARRY, BRANDON 11/30/2015, 9:01 AM  Discussed and agree with above. Possible dc home today if passes PT and pain/nausea controlled.   Teryl LucyJoshua Rishit Burkhalter, MD Cell [phone]

## 2015-11-30 NOTE — Progress Notes (Signed)
Occupational Therapy EvaluatiKETRINA [name] Details Name: Mary [name] MRN: [id number] DOB: 1950-07-06 Today's Date: 11/30/2015    History of Present Illness Pt is a 65 y/o female who presents s/p L unicompartmental knee replacement on 11/29/15.    Clinical Impression   All OT education completed and pt questions answered. No further OT needs at this time. Will sign off.    Follow Up Recommendations  No OT follow up;Supervision/Assistance - 24 hour    Equipment Recommendations  None recommended by OT    Recommendations for Other Services       Precautions / Restrictions Precautions Precautions: Knee Restrictions Weight Bearing Restrictions: Yes LLE Weight Bearing: Weight bearing as tolerated      Mobility Bed Mobility Overal bed mobility: Needs Assistance Bed Mobility: Supine to Sit       Sit to supine: Supervision      Transfers Overall transfer level: Needs assistance Equipment used: Rolling walker (2 wheeled) Transfers: Sit to/from Stand Sit to Stand: Min guard              Balance                                            ADL Overall ADL's : Needs assistance/impaired Eating/Feeding: Independent;Sitting   Grooming: Set up;Sitting   Upper Body Bathing: Set up;Sitting   Lower Body Bathing: Minimal assistance;Sit to/from stand   Upper Body Dressing : Set up;Sitting   Lower Body Dressing: Minimal assistance;Sit to/from stand   Toilet Transfer: Min guard;Ambulation;Comfort height toilet;RW   Toileting- Architect and Hygiene: Min guard;Sit to/from Nurse, children's Details (indicate cue type and reason): educated patient and husband on shower transfer technique and use of 3 in 1 in shower Functional mobility during ADLs: Min guard;Rolling walker General ADL Comments: Educated on LB self-care techniques (sit to stand, dress operated leg first), toilet transfer/toileting, walk-in shower transfer technique.  Patient and spouse verbalized understanding. Patient has all necessary DME.     Vision     Perception     Praxis      Pertinent Vitals/Pain Pain Assessment: 0-10 Pain Score: 6  Pain Location: L knee Pain Descriptors / Indicators: Aching;Dull Pain Intervention(s): Monitored during session;Repositioned;Premedicated before session     Hand Dominance Right   Extremity/Trunk Assessment Upper Extremity Assessment Upper Extremity Assessment: Overall WFL for tasks assessed   Lower Extremity Assessment Lower Extremity Assessment: Defer to PT evaluation   Cervical / Trunk Assessment Cervical / Trunk Assessment: Normal   Communication Communication Communication: No difficulties   Cognition Arousal/Alertness: Awake/alert Behavior During Therapy: WFL for tasks assessed/performed Overall Cognitive Status: Within Functional Limits for tasks assessed                     General Comments       Exercises       Shoulder Instructions      Home Living Family/patient expects to be discharged to:: Private residence Living Arrangements: Spouse/significant other Available Help at Discharge: Family;Available 24 hours/day Type of Home: House Home Access: Stairs to enter [employer] of Steps: 2 Entrance Stairs-Rails: Left Home Layout: One level     Bathroom Shower/Tub: Producer, television/film/video: Handicapped height Bathroom Accessibility: Yes How Accessible: Accessible via walker Home Equipment: Walker - 2 wheels;Cane - single point;Shower seat;Grab bars - tub/shower;Bedside commode  Prior Functioning/Environment Level of Independence: Independent             OT Diagnosis: Acute pain   OT Problem List: Decreased strength;Decreased range of motion;Decreased knowledge of use of DME or AE;Pain   OT Treatment/Interventions:      OT Goals(Current goals can be found in the care plan section) Acute Rehab OT Goals Patient Stated Goal:  Feel better OT Goal Formulation: All assessment and education complete, DC therapy  OT Frequency:     Barriers to D/C:            Co-evaluation              End of Session Equipment Utilized During Treatment: Rolling walker Nurse Communication: Mobility status  Activity Tolerance: Patient tolerated treatment well Patient left: in chair;with call bell/phone within reach;with family/visitor present   Time: [id number] OT Time Calculation (min): 39 min Charges:  OT General Charges $OT Visit: 1 Procedure OT Evaluation $OT Eval Low Complexity: 1 Procedure OT Treatments $Self Care/Home Management : 23-37 mins G-Codes: OT G-codes **NOT FOR INPATIENT CLASS** Functional Assessment Tool Used: clinical judgment Functional Limitation: Self care Self Care Current Status ([id number](G8987): At least 20 percent but less than 40 percent impaired, limited or restricted Self Care Goal Status ([id number]([id number]): At least 20 percent but less than 40 percent impaired, limited or restricted Self Care Discharge Status [phone]([id number]): At least 20 percent but less than 40 percent impaired, limited or restricted  Jori Thrall A 11/30/2015, 1:02 PM

## 2015-11-30 NOTE — Evaluation (Signed)
Physical Therapy Evaluation Patient Details Name: Mary RossSandra K Brummell MRN: [id number] DOB: 01/30/1951 Today's Date: 11/30/2015   History of Present Illness  Pt is a 65 y/o female who presents s/p L unicompartmental knee replacement on 11/29/15.   Clinical Impression  Pt admitted with above diagnosis. Pt currently with functional limitations due to the deficits listed below (see PT Problem List). At the time of PT eval pt was limited by nausea, dizziness, and feeling of near syncope. Overall, pt moving LLE well and was able to stand EOB with min guard assist. Will plan on a second PT session this afternoon to progress mobility as pt is able. Pt will benefit from skilled PT to increase their independence and safety with mobility to allow discharge to the venue listed below.       Follow Up Recommendations Home health PT;Supervision for mobility/OOB    Equipment Recommendations  None recommended by PT    Recommendations for Other Services       Precautions / Restrictions Precautions Precautions: Knee Precaution Comments: Pt and husband were educated on proper positioning with no roll, ice pack etc under knee.  Restrictions Weight Bearing Restrictions: Yes LLE Weight Bearing: Weight bearing as tolerated      Mobility  Bed Mobility Overal bed mobility: Needs Assistance Bed Mobility: Supine to Sit;Sit to Supine     Supine to sit: Supervision Sit to supine: Min assist   General bed mobility comments: Assist to elevate LE's urgently back into the bed from sitting position. Feel pt could have completed without assistance if more time was allowed.   Transfers Overall transfer level: Needs assistance Equipment used: Rolling walker (2 wheeled) Transfers: Sit to/from Stand Sit to Stand: Min guard         General transfer comment: Pt was able to power-up to full standing position with close hands-on guarding for safety and balance support. VC's for proper hand placement on seated surface  for safety.   Ambulation/Gait             General Gait Details: Pt became dizzy, nauseated and felt as if she was going to pass out when standing EOB. Pt was returned to sitting and urgently returned to supine where symptoms improved.   Stairs            Wheelchair Mobility    Modified Rankin (Stroke Patients Only)       Balance Overall balance assessment: Needs assistance Sitting-balance support: Feet supported;No upper extremity supported Sitting balance-Leahy Scale: Fair     Standing balance support: No upper extremity supported;During functional activity Standing balance-Leahy Scale: Poor Standing balance comment: Requires UE support at this time.                              Pertinent Vitals/Pain Pain Assessment: 0-10 Pain Score: 4  Pain Location: Knee Pain Descriptors / Indicators: Operative site guarding;Sore Pain Intervention(s): Limited activity within patient's tolerance;Monitored during session;Repositioned    Home Living Family/patient expects to be discharged to:: Private residence Living Arrangements: Spouse/significant other Available Help at Discharge: Family;Available 24 hours/day Type of Home: House Home Access: Stairs to enter Entrance Stairs-Rails: Left Entrance Stairs-Number of Steps: 2 Home Layout: One level Home Equipment: Emergency planning/management officerhower seat;Walker - 2 wheels;Cane - single point      Prior Function Level of Independence: Independent               Hand Dominance   Dominant Hand: Right  Extremity/Trunk Assessment   Upper Extremity Assessment: Defer to OT evaluation           Lower Extremity Assessment: LLE deficits/detail   LLE Deficits / Details: Decreased strength and AROM consistent with above mentioned procedure.   Cervical / Trunk Assessment: Normal  Communication   Communication: No difficulties  Cognition Arousal/Alertness: Awake/alert Behavior During Therapy: WFL for tasks  assessed/performed Overall Cognitive Status: Within Functional Limits for tasks assessed                      General Comments      Exercises Total Joint Exercises Ankle Circles/Pumps: 10 reps Quad Sets: 10 reps Heel Slides:  (Discussed) Hip ABduction/ADduction:  (Discussed)      Assessment/Plan    PT Assessment Patient needs continued PT services  PT Diagnosis Difficulty walking;Acute pain   PT Problem List Decreased strength;Decreased range of motion;Decreased activity tolerance;Decreased balance;Decreased mobility;Decreased knowledge of use of DME;Decreased safety awareness;Decreased knowledge of precautions;Pain  PT Treatment Interventions Gait training;Stair training;DME instruction;Functional mobility training;Therapeutic activities;Therapeutic exercise;Neuromuscular re-education;Patient/family education   PT Goals (Current goals can be found in the Care Plan section) Acute Rehab PT Goals Patient Stated Goal: Feel better PT Goal Formulation: With patient/family Time For Goal Achievement: 12/07/15 Potential to Achieve Goals: Good    Frequency 7X/week   Barriers to discharge        Co-evaluation               End of Session Equipment Utilized During Treatment: Gait belt Activity Tolerance: Treatment limited secondary to medical complications (Comment) (Nausea, dizziness, near syncope) Patient left: in bed;with call bell/phone within reach;with family/visitor present Nurse Communication: Mobility status    Functional Assessment Tool Used: Clinical judgement Functional Limitation: Mobility: Walking and moving around Mobility: Walking and Moving Around Current Status (E4540): At least 20 percent but less than 40 percent impaired, limited or restricted Mobility: Walking and Moving Around Goal Status [phone]): At least 1 percent but less than 20 percent impaired, limited or restricted    Time: 0753-0821 PT Time Calculation (min) (ACUTE ONLY): 28  min   Charges:   PT Evaluation $PT Eval Moderate Complexity: 1 Procedure PT Treatments $Therapeutic Activity: 8-22 mins   PT G Codes:   PT G-Codes **NOT FOR INPATIENT CLASS** Functional Assessment Tool Used: Clinical judgement Functional Limitation: Mobility: Walking and moving around Mobility: Walking and Moving Around Current Status (J4782): At least 20 percent but less than 40 percent impaired, limited or restricted Mobility: Walking and Moving Around Goal Status [phone]): At least 1 percent but less than 20 percent impaired, limited or restricted    Conni Slipper 11/30/2015, 8:52 AM   Conni Slipper, PT, DPT Acute Rehabilitation Services Pager: [phone]

## 2017-01-10 ENCOUNTER — Other Ambulatory Visit: Payer: Self-pay

## 2017-07-25 ENCOUNTER — Encounter (HOSPITAL_COMMUNITY): Payer: Self-pay | Admitting: Emergency Medicine

## 2017-07-25 ENCOUNTER — Emergency Department (HOSPITAL_COMMUNITY): Payer: Medicare Other

## 2017-07-25 ENCOUNTER — Emergency Department (HOSPITAL_COMMUNITY)
Admission: EM | Admit: 2017-07-25 | Discharge: 2017-07-25 | Disposition: A | Discharge status: Home / Self Care | Payer: Medicare Other | Attending: Emergency Medicine | Admitting: Emergency Medicine

## 2017-07-25 DIAGNOSIS — Z79899 Other long term (current) drug therapy: Secondary | ICD-10-CM | POA: Insufficient documentation

## 2017-07-25 DIAGNOSIS — J45909 Unspecified asthma, uncomplicated: Secondary | ICD-10-CM | POA: Insufficient documentation

## 2017-07-25 DIAGNOSIS — I480 Paroxysmal atrial fibrillation: Secondary | ICD-10-CM | POA: Diagnosis not present

## 2017-07-25 DIAGNOSIS — R002 Palpitations: Secondary | ICD-10-CM | POA: Diagnosis present

## 2017-07-25 HISTORY — DX: Paroxysmal atrial fibrillation: I48.0

## 2017-07-25 LAB — CBC
HEMATOCRIT: 45.7 % (ref 36.0–46.0)
Hemoglobin: 15 g/dL (ref 12.0–15.0)
MCH: 28.4 pg (ref 26.0–34.0)
MCHC: 32.8 g/dL (ref 30.0–36.0)
MCV: 86.6 fL (ref 78.0–100.0)
Platelets: 278 10*3/uL (ref 150–400)
RBC: 5.28 MIL/uL — ABNORMAL HIGH (ref 3.87–5.11)
RDW: 13.2 % (ref 11.5–15.5)
WBC: 7 10*3/uL (ref 4.0–10.5)

## 2017-07-25 LAB — BASIC METABOLIC PANEL
Anion gap: 9 (ref 5–15)
BUN: 21 mg/dL — AB (ref 6–20)
CALCIUM: 9.1 mg/dL (ref 8.9–10.3)
CO2: 22 mmol/L (ref 22–32)
Chloride: 104 mmol/L (ref 101–111)
Creatinine, Ser: 0.94 mg/dL (ref 0.44–1.00)
GFR calc Af Amer: >60 mL/min (ref >60)
GLUCOSE: 97 mg/dL (ref 65–99)
POTASSIUM: 4.4 mmol/L (ref 3.5–5.1)
Sodium: 135 mmol/L (ref 135–145)

## 2017-07-25 LAB — TROPONIN I: Troponin I: <0.03 ng/mL (ref <0.03)

## 2017-07-25 LAB — D-DIMER, QUANTITATIVE (NOT AT ARMC): D DIMER QUANT: 0.32 ug{FEU}/mL (ref 0.00–0.50)

## 2017-07-25 MED ORDER — METOPROLOL TARTRATE 25 MG PO TABS
25.0000 mg | ORAL_TABLET | Freq: Once | ORAL | Status: AC
  Administered 2017-07-25: 25 mg via ORAL
  Filled 2017-07-25: qty 1

## 2017-07-25 MED ORDER — RIVAROXABAN 20 MG PO TABS: 20.0000 mg | ORAL_TABLET | Freq: Every day | ORAL | 0 refills | Status: DC

## 2017-07-25 MED ORDER — METOPROLOL TARTRATE 5 MG/5ML IV SOLN
5.0000 mg | INTRAVENOUS | Status: AC | PRN
  Administered 2017-07-25 (×3): 5 mg via INTRAVENOUS
  Filled 2017-07-25 (×3): qty 5

## 2017-07-25 MED ORDER — METOPROLOL TARTRATE 25 MG PO TABS: 25.0000 mg | ORAL_TABLET | Freq: Two times a day (BID) | ORAL | 0 refills | Status: DC

## 2017-07-25 MED ORDER — SODIUM CHLORIDE 0.9 % IV BOLUS (SEPSIS)
1000.0000 mL | Freq: Once | INTRAVENOUS | Status: AC
  Administered 2017-07-25: 1000 mL via INTRAVENOUS

## 2017-07-25 MED ORDER — RIVAROXABAN 20 MG PO TABS
20.0000 mg | ORAL_TABLET | Freq: Once | ORAL | Status: AC
  Administered 2017-07-25: 20 mg via ORAL
  Filled 2017-07-25: qty 1

## 2017-07-25 NOTE — ED Notes (Signed)
Patient has appointment with Dr Wyline MoodBranch on Tuesday @ 2pm. Patient and RN Marcelino DusterMichelle made aware.

## 2017-07-25 NOTE — ED Triage Notes (Signed)
Pt reports waking up not feeling well this morning with generalized weakness.  States her watch was reading afib with rates over 120.  Denies chest pain/shortness of breath.

## 2017-07-25 NOTE — Progress Notes (Signed)
Discussed patient with ER staff. Presented with new diagnosis afib with elevated rates. Duration of afib is unclear. Rates improved with IV lopressor, patient is asymptomatic. Give dose of xarelto in ER. Her CHADS2Vasc score is 2. Would start oral lopressor 25mg  bid and continue xarelto 20mg  daily, ok for discharge from ER. We will arrange outpatient appointment in cardiology clinic.   Dina RichJonathan Daviona Herbert MD

## 2017-07-25 NOTE — Discharge Instructions (Signed)
As discussed, you have been diagnosed with atrial fibrillation. It is important to take your newly prescribed medication as directed, and be sure to follow-up with our cardiology colleagues. You will be contacted by the cardiology team for an appointment. In addition, our dedicated atrial fibrillation clinic may contact you as well.  Return here for concerning changes in your condition, otherwise follow-up as directed.

## 2017-07-25 NOTE — ED Provider Notes (Signed)
[facility]Y [facility]PART[facility] Provider Note   CSN: [id number] Arrival date & time: 07/25/17  1127     History   Chief Complaint Chief Complaint  Patient presents with  . Tachycardia    HPI Mary [name] is a 67 y.o. female.  HPI Previously well patient presents with fatigue, palpitations. Patient is healthy, has few medical issues, aside from child history of asthma, and predisposition towards bronchitis. However, she denies any ongoing dyspnea, fever, chills. Yesterday she felt more tired than usual with normal exertion, and today, soon after awakening she noticed palpitations, and rapid heart rate. This was described on her apple watch as being in atrial fibrillation, with a rate of 120. Symptoms have been persistent through now, without clear relieving or exacerbating factors, including exertion or inspiration. No recent lower extremity swelling. Patient does have notable history of left knee replacement 2 years ago, with transient use of Xarelto. She takes no blood thinning medication currently. She also has a recent trip to [place], and returned back within the past week after driving for her ski  excursion. Patient is here with her husband who assists with the HPI. Past Medical History:  Diagnosis Date  . Anemia    as a child  . Arthritis   . Asthma    has a rescue inhaler  . Complication of anesthesia    slow to wake up  . Diverticulosis   . Headache    "mild migraine"  . Hepatitis    C from Blood transfusion in 1979  . History of blood transfusion 1979   Hep C  . History of bronchitis 2016  . Joint pain   . Joint swelling   . Pneumonia    last time about 2 yrs ago  . Primary localized osteoarthritis of left knee 11/29/2015  . Seasonal allergies    takes Zyrtec daily and uses Flonase    Patient Active Problem List   Diagnosis Date Noted  . Primary localized osteoarthritis of left knee 11/29/2015  . S/P left unicompartmental knee replacement  11/29/2015    Past Surgical History:  Procedure Laterality Date  . COLONOSCOPY    . COLONOSCOPY N/A 11/02/2013   Procedure: COLONOSCOPY;  Surgeon: Corbin Adeobert M Rourk, MD;  [facility];  Service: Endoscopy;  Laterality: N/A;  10:30 AM  . ESOPHAGOGASTRODUODENOSCOPY    . JOINT REPLACEMENT     left knee  . KNEE ARTHROSCOPY Left 11/29/2015   Procedure: LEFT KNEE ARTHROSCOPY CHONDROPLASTY ;  Surgeon: Teryl LucyJoshua Landau, MD;  [facility];  Service: Orthopedics;  Laterality: Left;  . KNEE SURGERY Left 11/29/2015   Left knee arthroscopy with chondroplasty of the patellofemoral joint, with open unicompartmental left medial knee replacement  . PARTIAL KNEE ARTHROPLASTY Left 11/29/2015   Procedure: LEFT UNICOMPARTMENTAL KNEE ARTHROPLASTY;  Surgeon: Teryl LucyJoshua Landau, MD;  Location: [facility] OR;  Service: Orthopedics;  Laterality: Left;  . REFRACTIVE SURGERY    . Right knee arthroscopy Right   . TUBAL LIGATION      OB History    No data available       Home Medications    Prior to Admission medications   Medication Sig Start Date End Date Taking? Authorizing Provider  Bioflavonoid Products (BIOFLEX PO) Take 1 tablet by mouth daily.   Yes [provider]  cetirizine (ZYRTEC) 10 MG tablet Take 10 mg by mouth daily.   Yes [provider]  Multiple Vitamin (MULTIVITAMIN) tablet Take 1 tablet by mouth daily as needed (for  supplementation).    Yes [provider]  naproxen (NAPROSYN) 250 MG tablet Take 250 mg by mouth 3 (three) times daily as needed for moderate pain.   Yes [provider]  albuterol (PROVENTIL HFA;VENTOLIN HFA) 108 (90 Base) MCG/ACT inhaler Inhale 2 puffs into the lungs every 6 (six) hours as needed for wheezing or shortness of breath.    [provider]  baclofen (LIORESAL) 10 MG tablet Take 1 tablet (10 mg total) by mouth 3 (three) times daily. As needed for muscle spasm 11/29/15   Teryl Lucy, MD  fluticasone [facility]) 50 MCG/ACT nasal  spray Place 1 spray into both nostrils daily.     [provider]  ondansetron (ZOFRAN) 4 MG tablet Take 1 tablet (4 mg total) by mouth every 8 (eight) hours as needed for nausea or vomiting. 11/29/15   Teryl Lucy, MD  oxyCODONE-acetaminophen (PERCOCET) 10-325 MG tablet Take 1-2 tablets by mouth every 6 (six) hours as needed for pain. MAXIMUM TOTAL ACETAMINOPHEN DOSE IS 4000 MG PER DAY 11/29/15   Teryl Lucy, MD  rivaroxaban (XARELTO) 10 MG TABS tablet Take 1 tablet (10 mg total) by mouth daily. 11/29/15   Teryl Lucy, MD  sennosides-docusate sodium (SENOKOT-S) 8.6-50 MG tablet Take 2 tablets by mouth daily. 11/29/15   Teryl Lucy, MD    Family History Family History  Problem Relation Age of Onset  . Colon cancer Neg Hx     Social History Social History   Tobacco Use  . Smoking status: Never Smoker  . Smokeless tobacco: Never Used  Substance Use Topics  . Alcohol use: Yes    Comment: occasionally  . Drug use: No     Allergies   Penicillins and Shellfish allergy   Review of Systems Review of Systems  Constitutional:       Per HPI, otherwise negative  HENT:       Per HPI, otherwise negative  Respiratory:       Per HPI, otherwise negative  Cardiovascular:       Per HPI, otherwise negative  Gastrointestinal: Negative for vomiting.  Endocrine:       Negative aside from HPI  Genitourinary:       Neg aside from HPI   Musculoskeletal:       Per HPI, otherwise negative  Skin: Negative.   Neurological: Negative for syncope.     Physical Exam Updated Vital Signs BP 100/80   Pulse (!) 101   Temp 98 F (36.7 C) (Oral)   Resp 18   Ht 5\' 5"  (1.651 m)   Wt 74.8 kg (165 lb)   SpO2 99%   BMI 27.46 kg/m   Physical Exam  Constitutional: She is oriented to person, place, and time. She appears well-developed and well-nourished. No distress.  HENT:  Head: Normocephalic and atraumatic.  Eyes: Conjunctivae and EOM are normal.  Cardiovascular: Regular  rhythm.  Tachycardic, regular  Pulmonary/Chest: Effort normal and breath sounds normal. No stridor. No respiratory distress.  Abdominal: She exhibits no distension.  Neurological: She is alert and oriented to person, place, and time. No cranial nerve deficit.  Skin: Skin is warm and dry.  Psychiatric: She has a normal mood and affect.  Nursing note and vitals reviewed.    ED Treatments / Results  Labs (all labs ordered are listed, but only abnormal results are displayed) Labs Reviewed  BASIC METABOLIC PANEL - Abnormal; Notable for the following components:      Result Value   BUN 21 (*)  All other components within normal limits  CBC - Abnormal; Notable for the following components:   RBC 5.28 (*)    All other components within normal limits  TROPONIN I  D-DIMER, QUANTITATIVE (NOT AT [facility])    EKG #1 Atrial fibrillation with rate 137, minor artifact, nonspecific ST changes, abnormal     EKG Interpretation #2  Date/Time:  Thursday July 25 2017 16:00:35 EST Ventricular Rate:  98   Atrial fibrillation Borderline left axis deviation Low voltage, precordial leads Artifact Abnormal ekg Confirmed by Gerhard Munch [phone]) on 07/25/2017 4:07:52 PM        Radiology Dg Chest 2 View  Result Date: 07/25/2017 CLINICAL DATA:  Tachycardia, weakness. Symptoms began yesterday. History of asthma-bronchitis, previous episodes of pneumonia. EXAM: CHEST - 2 VIEW COMPARISON:  Chest x-ray of November 02, 2016 FINDINGS: The lungs are well-expanded. There is no focal infiltrate. There is chronic blunting of the posterior costophrenic angle on the left. There is a prominent epicardial fat pad in the right cardio phrenic angle anteriorly which is stable. The heart and pulmonary vascularity are normal. The mediastinum is normal in width. The bony thorax is unremarkable. IMPRESSION: Chronic bronchitic-reactive airway changes. No alveolar pneumonia nor CHF. Electronically Signed   By: David  [place] M.D.    On: 07/25/2017 12:27    Procedures Procedures (including critical care time)  Medications Ordered in ED Medications  metoprolol tartrate (LOPRESSOR) tablet 25 mg (not administered)  sodium chloride 0.9 % bolus 1,000 mL (0 mLs Intravenous Stopped 07/25/17 1325)  metoprolol tartrate (LOPRESSOR) injection 5 mg (5 mg Intravenous Given 07/25/17 1525)  rivaroxaban (XARELTO) tablet 20 mg (20 mg Oral Given 07/25/17 1357)     Initial Impression / Assessment and Plan / ED Course  I have reviewed the triage vital signs and the nursing notes.  Pertinent labs & imaging results that were available during my care of the patient were reviewed by me and considered in my medical decision making (see chart for details).     4:03 PM Patient remains in similar condition. Labs reassuring, with no change in d-dimer, and x-ray does not show pneumonia, infiltrate, pneumothorax. Given the persistent A. fib, unclear time of onset, patient will receive beta-blocker for rate control.  This patients CHA2DS2-VASc Score and unadjusted Ischemic Stroke Rate (% per year) is equal to 2.2 % stroke rate/year from a score of 2  Above score calculated as 1 point each if present [CHF, HTN, DM, Vascular=MI/PAD/Aortic Plaque, Age if 65-74, or Female] Above score calculated as 2 points each if present [Age > 75, or Stroke/TIA/TE]  4:03 PM Patient's heart rate 90s, A. fib, symptoms, however have resolved, the patient is smiling, stating that she feels much better. I discussed patient's case with her cardiologist on call, Dr. Wyline Mood. We discussed the patient's initiation of Xarelto, and given her resolution of symptoms, with appropriate rate control, the patient will transition to oral Lopressor, and follow-up with him in the office. Patient and husband aware of all findings, follow-up and return precautions. Absent evidence for ongoing distress, no chest pain, no evidence for ongoing ischemia, no evidence for PE/DVT, with normal  d-dimer, the patient is appropriate for close outpatient follow-up. Final Clinical Impressions(s) / ED Diagnoses   Final diagnoses:  Paroxysmal atrial fibrillation (HCC)    CRITICAL CARE Performed by: Gerhard Munch Total critical care time: 35 minutes Critical care time was exclusive of separately billable procedures and treating other patients. Critical care was necessary to treat or prevent imminent  or life-threatening deterioration. Critical care was time spent personally by me on the following activities: development of treatment plan with patient and/or surrogate as well as nursing, discussions with consultants, evaluation of patient's response to treatment, examination of patient, obtaining history from patient or surrogate, ordering and performing treatments and interventions, ordering and review of laboratory studies, ordering and review of radiographic studies, pulse oximetry and re-evaluation of patient's condition.   ED Discharge Orders        Ordered    Amb referral to AFIB Clinic     07/25/17 1335       Gerhard Munch, MD 07/25/17 1609

## 2017-07-30 ENCOUNTER — Encounter: Payer: Self-pay | Admitting: Cardiology

## 2017-07-30 ENCOUNTER — Ambulatory Visit: Payer: Medicare Other | Admitting: Cardiology

## 2017-07-30 VITALS — BP 112/82 | HR 59 | Ht 65.5 in | Wt 169.0 lb

## 2017-07-30 DIAGNOSIS — I480 Paroxysmal atrial fibrillation: Secondary | ICD-10-CM | POA: Diagnosis not present

## 2017-07-30 MED ORDER — APIXABAN 5 MG PO TABS: 5.0000 mg | ORAL_TABLET | Freq: Two times a day (BID) | ORAL | 3 refills | Status: DC

## 2017-07-30 NOTE — Progress Notes (Signed)
Clinical Summary Mary [name] is a 67 y.o.female seen as new patient, referred by Dr Jeraldine LootsLockwood during recent ER visit for new onset afib   1. Afib - new diagnosis during 07/25/17 ER visit. EKG showed afib with RVR - rates and symptoms improved with av nodal agents given in the ER. Started on oral lopressor and xarelto and discharged from ER. CHADS2Vasc score is at least 2.  - K 4.4. Do not see TSH or Mg results. D-dimer negative.   - felt weak,fatigue, palpitations. Had some new DOE at that time. - tolerating lopressor without troubles. Short duration mild nose bleed on anticoag but now resolved.     Past Medical History:  Diagnosis Date  . Anemia    as a child  . Arthritis   . Asthma    has a rescue inhaler  . Complication of anesthesia    slow to wake up  . Diverticulosis   . Headache    "mild migraine"  . Hepatitis    C from Blood transfusion in 1979  . History of blood transfusion 1979   Hep C  . History of bronchitis 2016  . Joint pain   . Joint swelling   . Pneumonia    last time about 2 yrs ago  . Primary localized osteoarthritis of left knee 11/29/2015  . Seasonal allergies    takes Zyrtec daily and uses Flonase     Allergies PCN Shellfish   Current Outpatient Medications  Medication Sig Dispense Refill  . albuterol (PROVENTIL HFA;VENTOLIN HFA) 108 (90 Base) MCG/ACT inhaler Inhale 2 puffs into the lungs every 6 (six) hours as needed for wheezing or shortness of breath.    Marland Kitchen. Bioflavonoid Products (BIOFLEX PO) Take 1 tablet by mouth daily.    . cetirizine (ZYRTEC) 10 MG tablet Take 10 mg by mouth daily.    . fluticasone (FLONASE) 50 MCG/ACT nasal spray Place 1 spray into both nostrils daily.     . metoprolol tartrate (LOPRESSOR) 25 MG tablet Take 1 tablet (25 mg total) by mouth 2 (two) times daily. 60 tablet 0  . Multiple Vitamin (MULTIVITAMIN) tablet Take 1 tablet by mouth daily as needed (for supplementation).     . naproxen (NAPROSYN) 250 MG tablet Take  250 mg by mouth 3 (three) times daily as needed for moderate pain.    . rivaroxaban (XARELTO) 20 MG TABS tablet Take 1 tablet (20 mg total) by mouth daily. 30 tablet 0   No current facility-administered medications for this visit.      Past Surgical History:  Procedure Laterality Date  . COLONOSCOPY    . COLONOSCOPY N/A 11/02/2013   Procedure: COLONOSCOPY;  Surgeon: Corbin Adeobert M Rourk, MD;  [facility];  Service: Endoscopy;  Laterality: N/A;  10:30 AM  . ESOPHAGOGASTRODUODENOSCOPY    . JOINT REPLACEMENT     left knee  . KNEE ARTHROSCOPY Left 11/29/2015   Procedure: LEFT KNEE ARTHROSCOPY CHONDROPLASTY ;  Surgeon: Teryl LucyJoshua Landau, MD;  [facility];  Service: Orthopedics;  Laterality: Left;  . KNEE SURGERY Left 11/29/2015   Left knee arthroscopy with chondroplasty of the patellofemoral joint, with open unicompartmental left medial knee replacement  . PARTIAL KNEE ARTHROPLASTY Left 11/29/2015   Procedure: LEFT UNICOMPARTMENTAL KNEE ARTHROPLASTY;  Surgeon: Teryl LucyJoshua Landau, MD;  Location: [facility] OR;  Service: Orthopedics;  Laterality: Left;  . REFRACTIVE SURGERY    . Right knee arthroscopy Right   . TUBAL LIGATION  Family History  Problem Relation Age of Onset  . Colon cancer Neg Hx      Social History Ms. Maselli reports that  has never smoked. she has never used smokeless tobacco. Ms. Rosenau reports that she drinks alcohol.   Review of Systems CONSTITUTIONAL: No weight loss, fever, chills, weakness or fatigue.  HEENT: Eyes: No visual loss, blurred vision, double vision or yellow sclerae.No hearing loss, sneezing, congestion, runny nose or sore throat.  SKIN: No rash or itching.  CARDIOVASCULAR: per hpi RESPIRATORY: No shortness of breath, cough or sputum.  GASTROINTESTINAL: No anorexia, nausea, vomiting or diarrhea. No abdominal pain or blood.  GENITOURINARY: No burning on urination, no polyuria NEUROLOGICAL: No headache, dizziness, syncope, paralysis, ataxia,  numbness or tingling in the extremities. No change in bowel or bladder control.  MUSCULOSKELETAL: No muscle, back pain, joint pain or stiffness.  LYMPHATICS: No enlarged nodes. No history of splenectomy.  PSYCHIATRIC: No history of depression or anxiety.  ENDOCRINOLOGIC: No reports of sweating, cold or heat intolerance. No polyuria or polydipsia.  Marland Kitchen   Physical Examination Vitals:   07/30/17 1407 07/30/17 1413  BP: 116/86 112/82  Pulse: (!) 59   SpO2: 97%    Vitals:   07/30/17 1407  Weight: 169 lb (76.7 kg)  Height: 5' 5.5" (1.664 m)    Gen: resting comfortably, no acute distress HEENT: no scleral icterus, pupils equal round and reactive, no palptable cervical adenopathy,  CV: RRR, no m//rg, no jvd Resp: Clear to auscultation bilaterally GI: abdomen is soft, non-tender, non-distended, normal bowel sounds, no hepatosplenomegaly MSK: extremities are warm, no edema.  Skin: warm, no rash Neuro:  no focal deficits Psych: appropriate affect    Assessment and Plan  1. PAF - new diagnosis during recent ER visit, records, labs and EKGs reviewed - EKG today shows she is back in NSR - tolerating beta blocker, no recurrent symptoms. Conitnue current medical therapy - CHADS2Vasc score is 2 (gender, age), continue xarelto - in setting of new onset afib obtain echo, TSH, and Mg    F/u 6 months   Antoine Poche, M.D.

## 2017-07-30 NOTE — Patient Instructions (Signed)
Medication Instructions:  Your physician recommends that you continue on your current medications as directed. Please refer to the Current Medication list given to you today.   Labwork: TSH Magnesium   Testing/Procedures: Your physician has requested that you have an echocardiogram. Echocardiography is a painless test that uses sound waves to create images of your heart. It provides your doctor with information about the size and shape of your heart and how well your heart's chambers and valves are working. This procedure takes approximately one hour. There are no restrictions for this procedure.    Follow-Up: Your physician wants you to follow-up in: 6 months.  You will receive a reminder letter in the mail two months in advance. If you don't receive a letter, please call our office to schedule the follow-up appointment.   Any Other Special Instructions Will Be Listed Below (If Applicable).     If you need a refill on your cardiac medications before your next appointment, please call your pharmacy.

## 2017-08-02 ENCOUNTER — Encounter: Payer: Self-pay | Admitting: Cardiology

## 2017-08-05 ENCOUNTER — Ambulatory Visit (HOSPITAL_COMMUNITY)
Admission: RE | Admit: 2017-08-05 | Discharge: 2017-08-05 | Disposition: A | Discharge status: Home / Self Care | Payer: Medicare Other | Source: Ambulatory Visit | Attending: Cardiology | Admitting: Cardiology

## 2017-08-05 ENCOUNTER — Other Ambulatory Visit (HOSPITAL_COMMUNITY)
Admission: RE | Admit: 2017-08-05 | Discharge: 2017-08-05 | Disposition: A | Discharge status: Home / Self Care | Payer: Medicare Other | Source: Ambulatory Visit | Attending: Cardiology | Admitting: Cardiology

## 2017-08-05 DIAGNOSIS — I480 Paroxysmal atrial fibrillation: Secondary | ICD-10-CM | POA: Insufficient documentation

## 2017-08-05 LAB — TSH: TSH: 4.343 u[IU]/mL (ref 0.350–4.500)

## 2017-08-05 LAB — MAGNESIUM: MAGNESIUM: 2.1 mg/dL (ref 1.7–2.4)

## 2017-08-05 NOTE — Progress Notes (Signed)
*  PRELIMINARY RESULTS* Echocardiogram 2D Echocardiogram has been performed.  Stacey DrainWhite, Lanecia Sliva J 08/05/2017, 12:16 PM

## 2017-08-08 ENCOUNTER — Encounter (HOSPITAL_COMMUNITY): Payer: Self-pay | Admitting: *Deleted

## 2017-08-08 ENCOUNTER — Other Ambulatory Visit: Payer: Self-pay

## 2017-08-08 ENCOUNTER — Emergency Department (HOSPITAL_COMMUNITY)
Admission: EM | Admit: 2017-08-08 | Discharge: 2017-08-09 | Disposition: A | Discharge status: Home / Self Care | Payer: Medicare Other | Attending: Emergency Medicine | Admitting: Emergency Medicine

## 2017-08-08 DIAGNOSIS — R110 Nausea: Secondary | ICD-10-CM | POA: Insufficient documentation

## 2017-08-08 DIAGNOSIS — J45909 Unspecified asthma, uncomplicated: Secondary | ICD-10-CM | POA: Diagnosis not present

## 2017-08-08 DIAGNOSIS — I4891 Unspecified atrial fibrillation: Secondary | ICD-10-CM | POA: Insufficient documentation

## 2017-08-08 DIAGNOSIS — Z96652 Presence of left artificial knee joint: Secondary | ICD-10-CM | POA: Diagnosis not present

## 2017-08-08 DIAGNOSIS — R079 Chest pain, unspecified: Secondary | ICD-10-CM

## 2017-08-08 DIAGNOSIS — Z79899 Other long term (current) drug therapy: Secondary | ICD-10-CM | POA: Insufficient documentation

## 2017-08-08 DIAGNOSIS — Z7901 Long term (current) use of anticoagulants: Secondary | ICD-10-CM | POA: Diagnosis not present

## 2017-08-08 DIAGNOSIS — R42 Dizziness and giddiness: Secondary | ICD-10-CM | POA: Diagnosis not present

## 2017-08-08 MED ORDER — METOPROLOL TARTRATE 5 MG/5ML IV SOLN
5.0000 mg | INTRAVENOUS | Status: DC | PRN
  Administered 2017-08-08 – 2017-08-09 (×3): 5 mg via INTRAVENOUS
  Filled 2017-08-08 (×4): qty 5

## 2017-08-08 NOTE — ED Triage Notes (Signed)
Pt c/o irregular heartbeat, feeling weak, tired, chest pressure that started this evening, according to pt's watch rhythm was a-fib which pt states that she was recently diagnosed with.

## 2017-08-09 LAB — BASIC METABOLIC PANEL
Anion gap: 9 (ref 5–15)
BUN: 15 mg/dL (ref 6–20)
CHLORIDE: 107 mmol/L (ref 101–111)
CO2: 24 mmol/L (ref 22–32)
CREATININE: 0.84 mg/dL (ref 0.44–1.00)
Calcium: 8.6 mg/dL — ABNORMAL LOW (ref 8.9–10.3)
GFR calc Af Amer: >60 mL/min (ref >60)
GFR calc non Af Amer: >60 mL/min (ref >60)
Glucose, Bld: 96 mg/dL (ref 65–99)
Potassium: 4.1 mmol/L (ref 3.5–5.1)
Sodium: 140 mmol/L (ref 135–145)

## 2017-08-09 LAB — CBC
HCT: 40.2 % (ref 36.0–46.0)
Hemoglobin: 13.3 g/dL (ref 12.0–15.0)
MCH: 28.9 pg (ref 26.0–34.0)
MCHC: 33.1 g/dL (ref 30.0–36.0)
MCV: 87.2 fL (ref 78.0–100.0)
PLATELETS: 213 10*3/uL (ref 150–400)
RBC: 4.61 MIL/uL (ref 3.87–5.11)
RDW: 13.3 % (ref 11.5–15.5)
WBC: 5.6 10*3/uL (ref 4.0–10.5)

## 2017-08-09 LAB — TROPONIN I
Troponin I: <0.03 ng/mL (ref <0.03)
Troponin I: <0.03 ng/mL (ref <0.03)

## 2017-08-09 MED ORDER — DILTIAZEM HCL-DEXTROSE 100-5 MG/100ML-% IV SOLN (PREMIX)
5.0000 mg/h | Freq: Once | INTRAVENOUS | Status: AC
  Administered 2017-08-09: 5 mg/h via INTRAVENOUS
  Filled 2017-08-09: qty 100

## 2017-08-09 MED ORDER — DILTIAZEM HCL 30 MG PO TABS
30.0000 mg | ORAL_TABLET | Freq: Once | ORAL | Status: AC
  Administered 2017-08-09: 30 mg via ORAL
  Filled 2017-08-09: qty 1

## 2017-08-09 MED ORDER — SODIUM CHLORIDE 0.9 % IV BOLUS (SEPSIS)
500.0000 mL | Freq: Once | INTRAVENOUS | Status: AC
  Administered 2017-08-09: 500 mL via INTRAVENOUS

## 2017-08-09 MED ORDER — FLECAINIDE ACETATE 50 MG PO TABS: 50.0000 mg | ORAL_TABLET | Freq: Two times a day (BID) | ORAL | 0 refills | Status: DC

## 2017-08-09 MED ORDER — DILTIAZEM HCL 25 MG/5ML IV SOLN
5.0000 mg | Freq: Once | INTRAVENOUS | Status: AC
  Administered 2017-08-09: 5 mg via INTRAVENOUS
  Filled 2017-08-09: qty 5

## 2017-08-09 NOTE — Discharge Instructions (Addendum)
Take the medication as prescribed.  Follow-up at the cardiology office as directed by Dr. Tenny Crawoss.  Return if concern for any reason

## 2017-08-09 NOTE — ED Notes (Signed)
Dr. Tenny Crawoss with Cardiology called and sent to EDP Dr. Sherri SearJacubawitz at [phone].

## 2017-08-09 NOTE — ED Provider Notes (Signed)
[facility] EMERGENCY DEPARTMENT Provider Note   CSN: [id number] Arrival date & time: 08/08/17  2228  Time seen 23:15 PM   History   Chief Complaint Chief Complaint  Patient presents with  . Chest Pain    HPI Mary [name] is a 67 y.o. female.  HPI patient states she had her first documented episode of atrial fibrillation on March 7.  She is followed by Dr. Harl Bowie, cardiologist and is on Xarelto.  She takes it in the morning and did take it today.  She states that she takes metoprolol 25 mg twice a day and she normally takes it about 10 PM in the evening.  She states about 7:30 PM she started feeling shaky and had a central chest pressure like "someone sitting on my chest" that is still present.  She felt like her heart was racing.  She denies shortness of breath, or diaphoresis.  She states she had minimal nausea but no vomiting.  She states she felt very lightheaded and dizzy and felt like she was going to pass out when she ambulated.  She has an apple watch with an apple on it and when she checked it her she was told she was in atrial fib and her highest heart rate was 154.  She is still having chest pain but it is better.  She took her metoprolol 25 mg at 8 PM and states she is feeling better but still not back to her normal.  Family history she states her brother has had atrial fibrillation and he had an ablation done about 2 years ago and is doing well.  PCP Celene Squibb, MD Cardiology Dr Harl Bowie  Past Medical History:  Diagnosis Date  . Anemia    as a child  . Arthritis   . Asthma    has a rescue inhaler  . Atrial fibrillation ([place])   . Complication of anesthesia    slow to wake up  . Diverticulosis   . Headache    "mild migraine"  . Hepatitis    C from Blood transfusion in 1979  . History of blood transfusion 1979   Hep C  . History of bronchitis 2016  . Joint pain   . Joint swelling   . Pneumonia    last time about 2 yrs ago  . Primary localized osteoarthritis  of left knee 11/29/2015  . Seasonal allergies    takes Zyrtec daily and uses Flonase    Patient Active Problem List   Diagnosis Date Noted  . Primary localized osteoarthritis of left knee 11/29/2015  . 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 COLONOSCOPY    . COLONOSCOPY N/A 11/02/2013   Procedure: COLONOSCOPY;  Surgeon: Daneil Dolin, MD;  [facility];  Service: Endoscopy;  Laterality: N/A;  10:30 AM  . ESOPHAGOGASTRODUODENOSCOPY    . JOINT REPLACEMENT     left knee  . KNEE ARTHROSCOPY Left 11/29/2015   Procedure: LEFT KNEE ARTHROSCOPY CHONDROPLASTY ;  Surgeon: Marchia Bond, MD;  Location: [place];  Service: Orthopedics;  Laterality: Left;  . KNEE SURGERY Left 11/29/2015   Left knee arthroscopy with chondroplasty of the patellofemoral joint, with open unicompartmental left medial knee replacement  . PARTIAL KNEE ARTHROPLASTY Left 11/29/2015   Procedure: LEFT UNICOMPARTMENTAL KNEE ARTHROPLASTY;  Surgeon: Marchia Bond, MD;  Location: [place];  Service: Orthopedics;  Laterality: Left;  . REFRACTIVE SURGERY    . Right  knee arthroscopy Right   . TUBAL LIGATION      OB History   None      Home Medications    Prior to Admission medications   Medication Sig Start Date End Date Taking? Authorizing Provider  albuterol (PROVENTIL HFA;VENTOLIN HFA) 108 (90 Base) MCG/ACT inhaler Inhale 2 puffs into the lungs every 6 (six) hours as needed for wheezing or shortness of breath.    [provider]  Bioflavonoid Products (BIOFLEX PO) Take 1 tablet by mouth daily.    [provider]  cetirizine (ZYRTEC) 10 MG tablet Take 10 mg by mouth daily.    [provider]  fluticasone (FLONASE) 50 MCG/ACT nasal spray Place 1 spray into both nostrils daily.     [provider]  metoprolol tartrate (LOPRESSOR) 25 MG tablet Take 1 tablet (25 mg total) by mouth 2 (two) times daily. 07/25/17    Carmin Muskrat, MD  Multiple Vitamin (MULTIVITAMIN) tablet Take 1 tablet by mouth daily as needed (for supplementation).     [provider]  naproxen (NAPROSYN) 250 MG tablet Take 250 mg by mouth 3 (three) times daily as needed for moderate pain.    [provider]  rivaroxaban (XARELTO) 20 MG TABS tablet Take 1 tablet (20 mg total) by mouth daily. 07/25/17 08/24/17  Carmin Muskrat, MD    Family History Family History  Problem Relation Age of Onset  . COPD Mother   . Heart disease Mother   . Non-Hodgkin's lymphoma Father   . Atrial fibrillation Brother   . Colon cancer Neg Hx     Social History Social History   Tobacco Use  . Smoking status: Never Smoker  . Smokeless tobacco: Never Used  Substance Use Topics  . Alcohol use: Yes    Comment: occasionally  . 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    Physical Exam Updated Vital Signs BP (!) 123/99   Pulse (!) 116   Temp 98 F (36.7 C) (Oral)   Resp 20   Ht _0  (1.676 m)   Wt 74.8 kg (165 lb)   SpO2 96%   BMI 26.63 kg/m   Vital signs normal except for tachycardia   Physical Exam  Constitutional: She is oriented to person, place, and time. She appears well-developed and well-nourished.  Non-toxic appearance. She does not appear ill. No distress.  HENT:  Head: Normocephalic and atraumatic.  Right Ear: External ear normal.  Left Ear: External ear normal.  Nose: Nose normal. No mucosal edema or rhinorrhea.  Mouth/Throat: Oropharynx is clear and moist and mucous membranes are normal. No dental abscesses or uvula swelling.  Eyes: Pupils are equal, round, and reactive to light. Conjunctivae and EOM are normal.  Neck: Normal range of motion and full passive range of motion without pain. Neck supple.  Cardiovascular: Normal heart sounds and normal  pulses. An irregularly irregular rhythm present. Tachycardia present. Exam reveals no gallop and no friction rub.  No murmur heard. Heart rate 103 - 120  during my exam  Pulmonary/Chest: Effort normal and breath sounds normal. No respiratory distress. She has no wheezes. She has no rhonchi. She has no rales. She exhibits no tenderness and no crepitus.  Abdominal: Soft. Normal appearance and bowel sounds are normal. She exhibits no distension. There is no tenderness. There is no  rebound and no guarding.  Musculoskeletal: Normal range of motion. She exhibits no edema or tenderness.  Moves all extremities well.   Neurological: She is alert and oriented to person, place, and time. She has normal strength. No cranial nerve deficit.  Skin: Skin is warm, dry and intact. No rash noted. No erythema. No pallor.  Psychiatric: She has a normal mood and affect. Her speech is normal and behavior is normal. Her mood appears not anxious.  Nursing note and vitals reviewed.    ED Treatments / Results  Labs (all labs ordered are listed, but only abnormal results are displayed) Results for orders placed or performed during the hospital encounter of 08/08/17  Troponin I  Result Value Ref Range   Troponin I <0.03 <0.03 ng/mL  Troponin I  Result Value Ref Range   Troponin I <0.03 <0.03 ng/mL    Laboratory interpretation all normal, patient had normal C met and CBC on March 7  On March 18 she had a normal TSH, magnesium   EKG  EKG Interpretation None     ED ECG REPORT   Date: 08/09/2017  Rate: 115  Rhythm: atrial fibrillation  QRS Axis: normal  Intervals: normal  ST/T Wave abnormalities: normal  Conduction Disutrbances:none  Narrative Interpretation:   Old EKG Reviewed: none available  I have personally reviewed the EKG tracing and agree with the computerized printout as noted.   Radiology No results found.   Dg Chest 2 View  Result Date: 07/25/2017 CLINICAL DATA:  Tachycardia,  weakness. Symptoms began yesterday. History of asthma-bronchitis, previous episodes of pneumonia.IMPRESSION: Chronic bronchitic-reactive airway changes. No alveolar pneumonia nor CHF. Electronically Signed   By: David  Martinique M.D.   On: 07/25/2017 12:27   Procedures .Critical Care Performed by: Rolland Porter, MD Authorized by: Rolland Porter, MD   Critical care provider statement:    Critical care time (minutes):  74   Critical care was necessary to treat or prevent imminent or life-threatening deterioration of the following conditions:  Cardiac failure and circulatory failure   Critical care was time spent personally by me on the following activities:  Discussions with consultants, evaluation of patient's response to treatment, examination of patient, obtaining history from patient or surrogate, ordering and review of laboratory studies, re-evaluation of patient's condition and review of old charts   (including critical care time)  Medications Ordered in ED Medications  metoprolol tartrate (LOPRESSOR) injection 5 mg (5 mg Intravenous Given 08/09/17 0215)  sodium chloride 0.9 % bolus 500 mL (0 mLs Intravenous Stopped 08/09/17 0111)  sodium chloride 0.9 % bolus 500 mL (0 mLs Intravenous Stopped 08/09/17 0309)  diltiazem (CARDIZEM) 100 mg in dextrose 5% 127m (1 mg/mL) infusion (0 mg/hr Intravenous Stopped 08/09/17 0718)  diltiazem (CARDIZEM) injection 5 mg (5 mg Intravenous Given 08/09/17 0321)  diltiazem (CARDIZEM) tablet 30 mg (30 mg Oral Given 08/09/17 0517)     Initial Impression / Assessment and Plan / ED Course  I have reviewed the triage vital signs and the nursing notes.  Pertinent labs & imaging results that were available during my care of the patient were reviewed by me and considered in my medical decision making (see chart for details).     Patient was just seen for the same symptoms on March 7.  I only did a troponin tonight due to her complaints of chest pain.  It will be a 4-hour  troponin after her pain started.  Patient was given IV Lopressor to see if that would control  her heart rate or convert her.  Recheck at 12:40 AM patient states she is feeling better.  She has had 1 dose of Lopressor and had to get a 500 cc bolus of normal saline when her blood pressure dropped in the night.  Her heart rate now is variable between 90 up to 120, her blood pressure is 458 systolic.  She was given another 500 cc bolus of normal saline and a repeat dose of Lopressor.  1:20 AM patient appears to be sleeping, patient's heart rate is 102-115, blood pressure 91/59.  I found out that the nurse had not given the repeat Lopressor because of the blood pressure.  I told him to wake the patient up and recheck her blood pressure and give the repeat Lopressor.  2:30 AM patient has had 3 doses of Lopressor IV.  Her blood pressure has stabilized.  However her heart rate is still over 100.  She was started on a low-dose Cardizem bolus due to the previous Lopressor boluses exam drip.  Recheck at 3:40 AM patient is resting comfortably, she states her chest pain is been going about 10 minutes.  Her heart rate now is in the 70-80 range, still in atrial fib relation.  Her blood pressure is 99/82.  I will talk to the cardiologist on call and see if she should be admitted or she can be discharged follow-up with her cardiologist now that her rate is controlled.  She is on her Xarelto and seems reliable that she is taking it.  A delta troponin was ordered.  5 AM patient discussed with the cardiology fellow on-call, Dr Wille Glaser, he recommends giving her oral short acting diltiazem 30 mg now and stopping her IV drip of diltiazem in a couple hours.  He then recommends observing for at least an hour off the drip to make sure her rate is controlled.  If she is doing well she can be discharged home with diltiazem 120 mg of the extended release.  If she is not rate controlled he suggests admission.  Also by that time  Dr. Harl Bowie or one of his colleagues will be in their office and can do an ED consult.  Patient was given her oral diltiazem about 5:15 AM.  Her IV diltiazem was stopped at 7:18 AM, her heart rate is in the 70s.  Per care plan discussed with cardiology fellow we will observe her for at least an hour after the IV diltiazem was stopped. Will ask Dr Winfred Leeds to talk to Dr Harl Bowie when he gets into his office this morning.   Final Clinical Impressions(s) / ED Diagnoses   Final diagnoses:  Atrial fibrillation with rapid ventricular response ([place])  Chest pain, unspecified type    Disposition pending  Rolland Porter, MD, Barbette Or, MD 08/09/17 [phone]

## 2017-08-09 NOTE — Consult Note (Signed)
Cardiology Consultation:   Patient ID: LADELLE TEODORO; [id number]; 1950/07/24   Admit date: 11/02/2013 Date of Consult: 08/09/2017  Primary Care Provider: Benita Stabile, MD Primary Cardiologist:[place] Primary Electrophysiologist:     Patient Profile:   Mary [name] is a 67 y.o. female with a hx of PAF who is being seen today for the evaluation of atrial fib  at the request of Dr Gerilyn Pilgrim  History of Present Illness:   Mary [name] is a 67 yo with history of atrila fib   She first presented on March 7 to the [facility] ED with afib with RVR  Started on Xarelto and lopressor.  Sent home Seen in cardiology clinic on 3/12.  Was in SR at that time   Plan for continued follow up   Echo was normal   TSH normal    Pt was doing OK since DC   Does say she is more sluggish on metorpolol    Last night about 10 PM heart started racing   Came to [facility] ED  In ED givin IV diltiazem   HR dropped   Diltiazme stopped earlier     Pt notes minimal epigastric tightness  Breathing is OK    Past Medical History:  Diagnosis Date  . Anemia    as a child  . Arthritis   . Asthma    has a rescue inhaler  . Atrial fibrillation ([facility])   . Complication of anesthesia    slow to wake up  . Diverticulosis   . Headache    "mild migraine"  . Hepatitis    C from Blood transfusion in 1979  . History of blood transfusion 1979   Hep C  . History of bronchitis 2016  . Joint pain   . Joint swelling   . Pneumonia    last time about 2 yrs ago  . Primary localized osteoarthritis of left knee 11/29/2015  . Seasonal allergies    takes Zyrtec daily and uses Flonase    Past Surgical History:  Procedure Laterality Date  . COLONOSCOPY    . COLONOSCOPY N/A 11/02/2013   Procedure: COLONOSCOPY;  Surgeon: Corbin Ade, MD;  [facility];  Service: Endoscopy;  Laterality: N/A;  10:30 AM  . ESOPHAGOGASTRODUODENOSCOPY    . JOINT REPLACEMENT     left knee  . KNEE ARTHROSCOPY Left 11/29/2015   Procedure: LEFT KNEE  ARTHROSCOPY CHONDROPLASTY ;  Surgeon: Teryl Lucy, MD;  Location: [facility] OR;  Service: Orthopedics;  Laterality: Left;  . KNEE SURGERY Left 11/29/2015   Left knee arthroscopy with chondroplasty of the patellofemoral joint, with open unicompartmental left medial knee replacement  . PARTIAL KNEE ARTHROPLASTY Left 11/29/2015   Procedure: LEFT UNICOMPARTMENTAL KNEE ARTHROPLASTY;  Surgeon: Teryl Lucy, MD;  Location: [facility] OR;  Service: Orthopedics;  Laterality: Left;  . REFRACTIVE SURGERY    . Right knee arthroscopy Right   . TUBAL LIGATION       Home meds  Metoprolol 25 bid  Xarelto 20 mg per day  Inpatient Medications: Scheduled Meds:  Continuous Infusions:  PRN Meds:   Allergies:      Social History:   Social History   Socioeconomic History  . Marital status: Married    Spouse name: Not on file  . Number of children: Not on file  . Years of education: Not on file  . Highest education level: Not on file  Occupational History  . Not on file  Social Needs  . Physicist, medical  strain: Not on file  . Food insecurity:    Worry: Not on file    Inability: Not on file  . Transportation needs:    Medical: Not on file    Non-medical: Not on file  Tobacco Use  . Smoking status: Never Smoker  . Smokeless tobacco: Never Used  Substance and Sexual Activity  . Alcohol use: Yes    Comment: occasionally  . Drug use: No  . Sexual activity: Yes  Lifestyle  . Physical activity:    Days per week: Not on file    Minutes per session: Not on file  . Stress: Not on file  Relationships  . Social connections:    Talks on phone: Not on file    Gets together: Not on file    Attends religious service: Not on file    Active member of club or organization: Not on file    Attends meetings of clubs or organizations: Not on file    Relationship status: Not on file  . Intimate partner violence:    Fear of current or ex partner: Not on file    Emotionally abused: Not on file    Physically  abused: Not on file    Forced sexual activity: Not on file  Other Topics Concern  . Not on file  Social History Narrative  . Not on file    [place] History:   [place]  Underwent ablation   Family History  Problem Relation Age of Onset  . COPD Mother   . Heart disease Mother   . Non-Hodgkin's lymphoma Father   . Atrial fibrillation Brother   . Colon cancer Neg Hx      ROS:  Please see the history of present illness.  Rare Etoh   All other ROS reviewed and negative.     Physical Exam/Data:   Vitals:   11/02/13 1100 11/02/13 1105 11/02/13 1119 11/02/13 1128  BP: 122/78 125/85 103/75 112/78  Pulse: 62 60 63 65  Resp: 12 12 14 18   Temp:   97.7 F (36.5 C)   TempSrc:   Oral   SpO2: 98% 97% 96% 98%  Weight:      Height:       No intake or output data in the 24 hours ending 08/09/17 1322 Filed Weights   11/02/13 0955  Weight: 170 lb (77.1 kg)   Body mass index is 27.86 kg/m.  General:  Well nourished, well developed, in no acute distress HEENT: normal Lymph: no adenopathy Neck: no JVD Endocrine:  No thryomegaly Vascular: No carotid bruits; FA pulses 2+ bilaterally without bruits  Cardiac:  normal S1, S2; Irreg Irreg ; no murmur  Lungs:  clear to auscultation bilaterally, no wheezing, rhonchi or rales  Abd: soft, nontender, no hepatomegaly  Ext: no edema Musculoskeletal:  No deformities, BUE and BLE strength normal and equal Skin: warm and dry  Neuro:  CNs 2-12 intact, no focal abnormalities noted Psych:  Normal affect   EKG:  The EKG was personally reviewed and demonstrates:  Atrial fib  115 bpm   Telemetry:  Telemetry was personally reviewed and demonstrates:  Atrial fib  80s    Relevant CV Studies:   Laboratory Data:  Chemistry Recent Labs  Lab 08/09/17 0835  NA 140  K 4.1  CL 107  CO2 24  GLUCOSE 96  BUN 15  CREATININE 0.84  CALCIUM 8.6*  GFRNONAA >60  GFRAA >60  ANIONGAP 9  No results for input(s): PROT,  ALBUMIN, AST, ALT, ALKPHOS, BILITOT in the last 168 hours. Hematology Recent Labs  Lab 08/09/17 0835  WBC 5.6  RBC 4.61  HGB 13.3  HCT 40.2  MCV 87.2  MCH 28.9  MCHC 33.1  RDW 13.3  PLT 213   Cardiac Enzymes Recent Labs  Lab 08/08/17 2340 08/09/17 0409  TROPONINI <0.03 <0.03   No results for input(s): TROPIPOC in the last 168 hours.  BNPNo results for input(s): BNP, PROBNP in the last 168 hours.  DDimer No results for input(s): DDIMER in the last 168 hours.  Radiology/Studies:  No results found.  Assessment and Plan:   1. Atrial fibrillation  Pt is a 67 yo who presented to ED with atrial fib with RVR   This is the second time in 1 month  She is very symptomatic going fast.  She currently with minimal epis  I have reviewed with pt and her husband treatment strategies for atrial fib     For now I would recomm continuing metoprolol   I would also add Flecanide 50 bid She will prob convert in the next couple days    If her HR increases she can take additional metoprolol  Continue Xarelto (CHADSVASc of 2)  She has not missed a dose   Appt made for pt at Afib clinic on 3/27 with J Allred at 11 AM  Will get EKG at that time   .  Dietrich Pates    For questions or updates, please contact [facility] Please consult [email or web address] for contact info under Cardiology/STEMI.   Signed, Dietrich Pates, MD  08/09/2017 1:22 PM

## 2017-08-09 NOTE — ED Provider Notes (Signed)
Care taken over from Dr. Lars MageI.  Knapp.  Patient developed chest tightness anterior and feeling of rapid heartbeat 7:30 PM yesterday.  She was treated with intravenous metoprolol as well as intravenous Cardizem and oral Cardizem while here.  She presently complains of mild chest tightness, minimal, anterior.  No shortness of breath.  No other associated symptoms.  On exam she is resting comfortably appears in no distress lungs clear to auscultation heart irregularly irregular.  Dr. Tenny Crawoss from cardiology service evaluated patient in the emergency department and deemed that she was stable for discharge and outpatient follow-up.  Dr. Tenny Crawoss arrange for outpatient follow-up she requested that I write prescription for flecainide 50 mg twice daily, 1 month supply which I gladly did Results for orders placed or performed during the hospital encounter of 08/08/17  Troponin I  Result Value Ref Range   Troponin I <0.03 <0.03 ng/mL  Troponin I  Result Value Ref Range   Troponin I <0.03 <0.03 ng/mL  Basic metabolic panel  Result Value Ref Range   Sodium 140 135 - 145 mmol/L   Potassium 4.1 3.5 - 5.1 mmol/L   Chloride 107 101 - 111 mmol/L   CO2 24 22 - 32 mmol/L   Glucose, Bld 96 65 - 99 mg/dL   BUN 15 6 - 20 mg/dL   Creatinine, Ser 1.610.84 0.44 - 1.00 mg/dL   Calcium 8.6 (L) 8.9 - 10.3 mg/dL   GFR calc non Af Amer >60 >60 mL/min   GFR calc Af Amer >60 >60 mL/min   Anion gap 9 5 - 15  CBC  Result Value Ref Range   WBC 5.6 4.0 - 10.5 K/uL   RBC 4.61 3.87 - 5.11 MIL/uL   Hemoglobin 13.3 12.0 - 15.0 g/dL   HCT 09.640.2 04.536.0 - 40.946.0 %   MCV 87.2 78.0 - 100.0 fL   MCH 28.9 26.0 - 34.0 pg   MCHC 33.1 30.0 - 36.0 g/dL   RDW 81.113.3 91.411.5 - 78.215.5 %   Platelets 213 150 - 400 K/uL   Dg Chest 2 View  Result Date: 07/25/2017 CLINICAL DATA:  Tachycardia, weakness. Symptoms began yesterday. History of asthma-bronchitis, previous episodes of pneumonia. EXAM: CHEST - 2 VIEW COMPARISON:  Chest x-ray of November 02, 2016 FINDINGS: The  lungs are well-expanded. There is no focal infiltrate. There is chronic blunting of the posterior costophrenic angle on the left. There is a prominent epicardial fat pad in the right cardio phrenic angle anteriorly which is stable. The heart and pulmonary vascularity are normal. The mediastinum is normal in width. The bony thorax is unremarkable. IMPRESSION: Chronic bronchitic-reactive airway changes. No alveolar pneumonia nor CHF. Electronically Signed   By: David  SwazilandJordan M.D.   On: 07/25/2017 12:27     Doug SouJacubowitz, Melonee Gerstel, MD 08/09/17 [phone]

## 2017-08-09 NOTE — ED Notes (Signed)
Took X[place]ta 20 mg from home per Dr Charline BillsI. Lynelle DoctorKnapp.  Pt to hold Lopressor until seen by [facility]

## 2017-08-09 NOTE — ED Notes (Signed)
Dr Tenny Crawoss in to see pt.  Verbal order to give pt her lopressor from home now.  MD at bedside and will inform pt.

## 2017-08-14 ENCOUNTER — Ambulatory Visit (HOSPITAL_COMMUNITY)
Admission: RE | Admit: 2017-08-14 | Discharge: 2017-08-14 | Disposition: A | Discharge status: Home / Self Care | Payer: Medicare Other | Source: Ambulatory Visit | Attending: Nurse Practitioner | Admitting: Nurse Practitioner

## 2017-08-14 ENCOUNTER — Encounter (HOSPITAL_COMMUNITY): Payer: Self-pay | Admitting: Nurse Practitioner

## 2017-08-14 VITALS — BP 122/76 | HR 62 | Ht 66.0 in | Wt 171.0 lb

## 2017-08-14 DIAGNOSIS — Z79899 Other long term (current) drug therapy: Secondary | ICD-10-CM | POA: Diagnosis not present

## 2017-08-14 DIAGNOSIS — Z7901 Long term (current) use of anticoagulants: Secondary | ICD-10-CM | POA: Diagnosis not present

## 2017-08-14 DIAGNOSIS — J45909 Unspecified asthma, uncomplicated: Secondary | ICD-10-CM | POA: Insufficient documentation

## 2017-08-14 DIAGNOSIS — I480 Paroxysmal atrial fibrillation: Secondary | ICD-10-CM | POA: Diagnosis present

## 2017-08-14 DIAGNOSIS — Z7951 Long term (current) use of inhaled steroids: Secondary | ICD-10-CM | POA: Diagnosis not present

## 2017-08-14 DIAGNOSIS — Z96652 Presence of left artificial knee joint: Secondary | ICD-10-CM | POA: Insufficient documentation

## 2017-08-14 MED ORDER — METOPROLOL TARTRATE 25 MG PO TABS: ORAL_TABLET | ORAL | 0 refills | Status: DC

## 2017-08-14 MED ORDER — DILTIAZEM HCL ER COATED BEADS 120 MG PO CP24: 120.0000 mg | ORAL_CAPSULE | Freq: Every day | ORAL | 6 refills | Status: DC

## 2017-08-14 NOTE — Patient Instructions (Signed)
Stop metoprolol daily  Start Cardizem 120mg  once a day  May use metoprolol as needed for breakthrough afib if heart rate is over 100 - can take 1 tablet every 6-8 hours   Scheduler will be in touch with you to see Dr. Johney FrameAllred in 4 weeks.

## 2017-08-14 NOTE — Progress Notes (Signed)
Primary Care Physician: Benita StabileHall, John Z, MD Primary Cardiologist: Dr Wyline MoodBranch Primary Electrophysiologist: Dr Johney FrameAllred Referring Physician: Dr Fleeta Emmeross   Mary [name] is a 67 y.o. female with a history of paroxysmal atrial fibrillation who presents [facility].  The patient was initially diagnosed with atrial fibrillation 07/25/17 after presenting with symptoms of tachypalpitations, fatigue, and decreased exercise tolerance.  She noticed that on a hike that she could not go her usual pace.  She felt "unwell" the next day.  She had just purchased an apple watch v 4 and this told her that she was in AFib.  She presented to [facility].  In the ED, she was started on metoprolol and converted to sinus rhythm.  She was placed on xarelto.  She did well until 2 weeks later when she developed recurrent symptoms of afib.  Her apple watch again confirmed afib.  She took her metoprolol and then [facility].  She was given IV lopressor again.  She was evaluated by Dr Tenny Crawoss and started on flecainide 50mg  BID.  She has had no further afib since that time.  Today, she denies symptoms of palpitations, chest pain, shortness of breath, orthopnea, PND, lower extremity edema, dizziness, presyncope, syncope, snoring, daytime somnolence, bleeding, or neurologic sequela. The patient is tolerating medications without difficulties and is otherwise without complaint today.    Atrial Fibrillation Risk Factors:  she has not had a sleep study,  She snores  she does not have a history of rheumatic fever.  she does not have a history of alcohol use.  she has a BMI of Body mass index is 27.6 kg/m.Marland Kitchen. Filed Weights   08/14/17 1110  Weight: 171 lb (77.6 kg)    LA size: normal   Atrial Fibrillation Management history:  Previous antiarrhythmic drugs: just started flecainide 1 week ago  Previous cardioversions: none  Previous ablations: none  CHADS2VASC  score: 2 (age, female)  Anticoagulation history: xarelto   Past Medical History:  Diagnosis Date  . Anemia    as a child  . Arthritis   . Asthma    has a rescue inhaler  . Atrial fibrillation (HCC) 07/25/2017  . Complication of anesthesia    slow to wake up  . Diverticulosis   . History of blood transfusion 1979   previously diagnosed with Hep C, though recently tested negative per patient  . History of bronchitis 2016  . Pneumonia    last time about 2 yrs ago  . Primary localized osteoarthritis of left knee 11/29/2015  . Seasonal allergies    takes Zyrtec daily and uses Flonase   Past Surgical History:  Procedure Laterality Date  . COLONOSCOPY    . COLONOSCOPY N/A 11/02/2013   Procedure: COLONOSCOPY;  Surgeon: Corbin Adeobert M Rourk, MD;  Location: [facility] SUITE;  Service: Endoscopy;  Laterality: N/A;  10:30 AM  . ESOPHAGOGASTRODUODENOSCOPY    . JOINT REPLACEMENT     left knee  . KNEE ARTHROSCOPY Left 11/29/2015   Procedure: LEFT KNEE ARTHROSCOPY CHONDROPLASTY ;  Surgeon: Teryl LucyJoshua Landau, MD;  [facility];  Service: Orthopedics;  Laterality: Left;  . KNEE SURGERY Left 11/29/2015   Left knee arthroscopy with chondroplasty of the patellofemoral joint, with open unicompartmental left medial knee replacement  . PARTIAL KNEE ARTHROPLASTY Left 11/29/2015   Procedure: LEFT UNICOMPARTMENTAL KNEE ARTHROPLASTY;  Surgeon: Teryl LucyJoshua Landau, MD;  Location: [facility] OR;  Service: Orthopedics;  Laterality: Left;  .  REFRACTIVE SURGERY    . Right knee arthroscopy Right   . TUBAL LIGATION      Current Outpatient Medications  Medication Sig Dispense Refill  . acetaminophen (TYLENOL) 325 MG suppository Place 325 mg rectally every 4 (four) hours as needed.    Marland Kitchen albuterol (PROVENTIL HFA;VENTOLIN HFA) 108 (90 Base) MCG/ACT inhaler Inhale 2 puffs into the lungs every 6 (six) hours as needed for wheezing or shortness of breath.    Marland Kitchen Bioflavonoid Products (BIOFLEX PO) Take 1 tablet by mouth daily.    .  cetirizine (ZYRTEC) 10 MG tablet Take 10 mg by mouth daily.    . flecainide (TAMBOCOR) 50 MG tablet Take 1 tablet (50 mg total) by mouth 2 (two) times daily. 60 tablet 0  . fluticasone (FLONASE) 50 MCG/ACT nasal spray Place 1 spray into both nostrils daily.     . metoprolol tartrate (LOPRESSOR) 25 MG tablet Take 1 tablet (25 mg total) by mouth 2 (two) times daily. 60 tablet 0  . Multiple Vitamin (MULTIVITAMIN) tablet Take 1 tablet by mouth daily as needed (for supplementation).     . rivaroxaban (XARELTO) 20 MG TABS tablet Take 1 tablet (20 mg total) by mouth daily. 30 tablet 0   No current facility-administered medications for this encounter.    Allergies reviewed   Social History   Socioeconomic History  . Marital status: Married    Spouse name: Not on [name]  . Number of children: Not on [name]  . Years of education: Not on [name]  . Highest education level: Not on [name]  Occupational History  . Not on [name]  Social Needs  . Financial resource strain: Not on [name]  . Food insecurity:    Worry: Not on [name]    Inability: Not on [name]  . Transportation needs:    Medical: Not on [name]    Non-medical: Not on [name]  Tobacco Use  . Smoking status: Never Smoker  . Smokeless tobacco: Never Used  Substance and Sexual Activity  . Alcohol use: Yes    Comment: occasionally  . Drug use: No  . Sexual activity: Yes  Lifestyle  . Physical activity:    Days per week: Not on [name]    Minutes per session: Not on [name]  . Stress: Not on [name]  Relationships  . Social connections:    Talks on phone: Not on [name]    Gets together: Not on [name]    Attends religious service: Not on [name]    Active member of club or organization: Not on [name]    Attends meetings of clubs or organizations: Not on [name]    Relationship status: Not on [name]  . Intimate partner violence:    Fear of current or ex partner: Not on [name]    Emotionally abused: Not on [name]    Physically abused: Not on [name]    Forced sexual  activity: Not on [name]  Other Topics Concern  . Not on [name]  Social History Narrative   Pt lives in [place] with spouse.  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 COPD Mother   . Heart disease Mother   . Non-Hodgkin's lymphoma Father   . Atrial fibrillation Brother   . Colon cancer Neg Hx    The patient does have a history of early familial atrial fibrillation or other arrhythmias.  Brother with lone af  at age 62.  ROS- All systems are reviewed and negative except as per the HPI above.  Physical Exam: Vitals:   08/14/17 1110  BP: 122/76  Pulse: 62  Weight: 171 lb (77.6 kg)  Height: 5\' 6"  (1.676 m)    GEN- The patient is well appearing, alert and oriented x 3 today.   Head- normocephalic, atraumatic Eyes-  Sclera clear, conjunctiva pink Ears- hearing intact Oropharynx- clear Neck- supple  Lungs- Clear to ausculation bilaterally, normal work of breathing Heart- Regular rate and rhythm, no murmurs, rubs or gallops  GI- soft, NT, ND, + BS Extremities- no clubbing, cyanosis, or edema MS- no significant deformity or atrophy Skin- no rash or lesion Psych- euthymic mood, full affect Neuro- strength and sensation are intact  Wt Readings from Last 3 Encounters:  08/14/17 171 lb (77.6 kg)  08/08/17 165 lb (74.8 kg)  07/30/17 169 lb (76.7 kg)    EKG today demonstrates sinus rhythm 62 bpm, PR 178 msec, QTc 440 msec, incomplete RBBB Echo 08/05/17 demonstrated normal EF, no valvular disease, normal LA size  Epic records are reviewed at length today  Assessment and Plan:  1. Lone paroxysmal atrial fibrillation The patient has symptomatic paroxysmal atrial fibrillation.  The patients CHAD2VASC score is 2.  she is  appropriately anticoagulated at this time. The patient is adequately rate controlled with metoprolol but has fatigue and does not feel that she is tolerating this  well. Antiarrhythmic therapy to dates has included flecainide (started last week).  The patients left atrial size is normal  Additional echo findings include normal EF. A long discussion with the patient was had today regarding therapeutic strategies.  Extensive discussion of lifestyle modification including decrease or avoid alcohol intake was also discussed.  Presently, our recommendations include change lopressor to prn Add diltiazem CD 120mg  daily  We discussed ablation.  If she does not feel well upon return, she will consider ablation as the next step.  Follow-up with me in 4 weeks   Mary Range, MD 08/14/2017 11:46 AM

## 2017-08-23 ENCOUNTER — Telehealth (HOSPITAL_COMMUNITY): Payer: Self-pay | Admitting: *Deleted

## 2017-08-23 MED ORDER — FLECAINIDE ACETATE 100 MG PO TABS: 100.0000 mg | ORAL_TABLET | Freq: Two times a day (BID) | ORAL | 3 refills | Status: DC

## 2017-08-23 NOTE — Telephone Encounter (Signed)
Patient called in stating she continues to have breakthrough afib. Seems to happen more at night than during the day - requires multiple doses of PRN metoprolol to convert back to NSR. HR in the 120-140s when in afib. Discussed with Rudi Cocoonna Carroll NP -will increase flecainide to 100mg  BID and reck EKG on Wednesday. Pt in agreement with plan.

## 2017-08-28 ENCOUNTER — Ambulatory Visit (HOSPITAL_COMMUNITY)
Admission: RE | Admit: 2017-08-28 | Discharge: 2017-08-28 | Disposition: A | Discharge status: Home / Self Care | Payer: Medicare Other | Source: Ambulatory Visit | Attending: Nurse Practitioner | Admitting: Nurse Practitioner

## 2017-08-28 ENCOUNTER — Encounter (HOSPITAL_COMMUNITY): Payer: Self-pay | Admitting: Nurse Practitioner

## 2017-08-28 DIAGNOSIS — I517 Cardiomegaly: Secondary | ICD-10-CM | POA: Insufficient documentation

## 2017-08-28 DIAGNOSIS — I4891 Unspecified atrial fibrillation: Secondary | ICD-10-CM | POA: Diagnosis not present

## 2017-08-28 DIAGNOSIS — R9431 Abnormal electrocardiogram [ECG] [EKG]: Secondary | ICD-10-CM | POA: Insufficient documentation

## 2017-08-28 MED ORDER — DILTIAZEM HCL ER COATED BEADS 120 MG PO CP24: 120.0000 mg | ORAL_CAPSULE | Freq: Every day | ORAL | 6 refills | Status: DC

## 2017-08-28 MED ORDER — RIVAROXABAN 20 MG PO TABS: 20.0000 mg | ORAL_TABLET | Freq: Every day | ORAL | 6 refills | Status: DC

## 2017-08-28 MED ORDER — FLECAINIDE ACETATE 100 MG PO TABS: 100.0000 mg | ORAL_TABLET | Freq: Two times a day (BID) | ORAL | 3 refills | Status: DC

## 2017-08-28 NOTE — Progress Notes (Signed)
EKG done for increase in flecainide to 100 mg bid 2/2 more afib occurrence. Intervals pr int 176 ms, qrs int 102 ms, qtc 443 ms, look stable as when compared to EKG from 50 mg bid. She has not noted any further afib. She has f/u with Dr. Johney FrameAllred 4/29

## 2017-09-16 ENCOUNTER — Encounter: Payer: Self-pay | Admitting: Internal Medicine

## 2017-09-16 ENCOUNTER — Ambulatory Visit: Payer: Medicare Other | Admitting: Internal Medicine

## 2017-09-16 VITALS — BP 126/74 | HR 59 | Ht 66.0 in | Wt 172.0 lb

## 2017-09-16 DIAGNOSIS — Z01812 Encounter for preprocedural laboratory examination: Secondary | ICD-10-CM | POA: Diagnosis not present

## 2017-09-16 DIAGNOSIS — I480 Paroxysmal atrial fibrillation: Secondary | ICD-10-CM

## 2017-09-16 NOTE — Patient Instructions (Addendum)
Medication Instructions:  Your physician recommends that you continue on your current medications as directed. Please refer to the Current Medication list given to you today.  Labwork: Your physician recommends that you return for pre procedure lab work between 5/9 - 5/21 for BMP & CBC **Will notify you of abnormal results, otherwise continue current treatment plan.  Testing/Procedures: Your physician has requested that you have cardiac CT w/i 7 days PRIOR to your ablation. Cardiac computed tomography (CT) is a painless test that uses an x-ray machine to take clear, detailed pictures of your heart. For further information please visit [email or web address] Please follow instruction sheet as given.  *The office will contact you to arrange this testing once we have cleared with your  Insurance.*  Your physician has recommended that you have an ablation. Catheter ablation is a medical procedure used to treat some cardiac arrhythmias (irregular heartbeats). During catheter ablation, a long, thin, flexible tube is put into a blood vessel in your groin (upper thigh), or neck. This tube is called an ablation catheter. It is then guided to your heart through the blood vessel. Radio frequency waves destroy small areas of heart tissue where abnormal heartbeats may cause an arrhythmia to start.   Instructions for your ablation: 1. Please arrive at the [facility], Main Entrance "A", of [facility] at 5:30 a.m. on 10/10/2017. 2. Do not eat or drink after midnight the night prior to the procedure. 3. Do not miss any doses of XARELTO prior to the morning of the procedure.  4. Do not take any medications the morning of the procedure. 5. You will shaved for this procedure (if needed). Typically both groins, chest and back  are shaven. We ask that you shave these areas yourself at home 1-2 days prior  to the procedure. If you are uncomfortable/unable, then hospital staff will shave  these areas the morning of  your procedure. 6. Plan for an overnight stay in the hospital. 7. You will need someone to drive you home at discharge.   Follow-Up: Your physician recommends that you schedule a follow-up appointment in: 4 weeks, after your procedure on 10/10/2017, with Rudi Coco in the AFib clinic.  Your physician recommends that you schedule a follow-up appointment in: 3 months, after your procedure on 10/10/2017, with Dr. Johney Frame.   * If you need a refill on your cardiac medications before your next appointment, please call your pharmacy.   *Please note that any paperwork needing to be filled out by the provider will need to be addressed at the front desk prior to seeing the provider. Please note that any FMLA, disability or other documents regarding health condition is subject to a $25.00 charge that must be received prior to completion of paperwork in the form of a money order or check.  Thank you for choosing [facility]!!     Any Other Special Instructions Will Be Listed Below (If Applicable).  Please arrive at the [facility] main entrance of [facility] at xx:xx AM (30-45 minutes prior to test start time)  [facility] [address] [place], [place] [address] [phone]  Proceed to the [facility] Radiology Department (First Floor).  Please follow these instructions carefully (unless otherwise directed):  Hold all erectile dysfunction medications at least 48 hours prior to test.  On the Night Before the Test: . Drink plenty of water. . Do not consume any caffeinated/decaffeinated beverages or chocolate 12 hours prior to your test. . Do not take  any antihistamines 12 hours prior to your test. . If you take Metformin do not take 24 hours prior to test. . If the patient has contrast allergy: ? Patient will need a prescription for Prednisone and very clear instructions (as follows): 1. Prednisone 50 mg - take 13 hours prior to test 2. Take another Prednisone  50 mg 7 hours prior to test 3. Take another Prednisone 50 mg 1 hour prior to test 4. Take Benadryl 50 mg 1 hour prior to test . Patient must complete all four doses of above prophylactic medications. . Patient will need a ride after test due to Benadryl.  On the Day of the Test: . Drink plenty of water. Do not drink any water within one hour of the test. . Do not eat any food 4 hours prior to the test. . You may take your regular medications prior to the test. . MAKE SURE TO TAKE YOUR LOPRESSOR THE DAY OF THIS TESTING . HOLD Furosemide morning of the test.  After the Test: . Drink plenty of water. . After receiving IV contrast, you may experience a mild flushed feeling. This is normal. . On occasion, you may experience a mild rash up to 24 hours after the test. This is not dangerous. If this occurs, you can take Benadryl 25 mg and increase your fluid intake. . If you experience trouble breathing, this can be serious. If it is severe call 911 IMMEDIATELY. If it is mild, please call our office. . If you take any of these medications: Glipizide/Metformin, Avandament, Glucavance, please do not take 48 hours after completing test.    Cardiac Ablation Cardiac ablation is a procedure to disable (ablate) a small amount of heart tissue in very specific places. The heart has many electrical connections. Sometimes these connections are abnormal and can cause the heart to beat very fast or irregularly. Ablating some of the problem areas can improve the heart rhythm or return it to normal. Ablation may be done for people who:  Have Wolff-Parkinson-White syndrome.  Have fast heart rhythms (tachycardia).  Have taken medicines for an abnormal heart rhythm (arrhythmia) that were not effective or caused side effects.  Have a high-risk heartbeat that may be life-threatening.  During the procedure, a small incision is made in the neck or the groin, and a long, thin, flexible tube (catheter) is inserted  into the incision and moved to the heart. Small devices (electrodes) on the tip of the catheter will send out electrical currents. A type of X-ray (fluoroscopy) will be used to help guide the catheter and to provide images of the heart. Tell a health care provider about:  Any allergies you have.  All medicines you are taking, including vitamins, herbs, eye drops, creams, and over-the-counter medicines.  Any problems you or family members have had with anesthetic medicines.  Any blood disorders you have.  Any surgeries you have had.  Any medical conditions you have, such as kidney failure.  Whether you are pregnant or may be pregnant. What are the risks? Generally, this is a safe procedure. However, problems may occur, including:  Infection.  Bruising and bleeding at the catheter insertion site.  Bleeding into the chest, especially into the sac that surrounds the heart. This is a serious complication.  Stroke or blood clots.  Damage to other structures or organs.  Allergic reaction to medicines or dyes.  Need for a permanent pacemaker if the normal electrical system is damaged. A pacemaker is a small computer that  sends electrical signals to the heart and helps your heart beat normally.  The procedure not being fully effective. This may not be recognized until months later. Repeat ablation procedures are sometimes required.  What happens before the procedure?  Follow instructions from your health care provider about eating or drinking restrictions.  Ask your health care provider about: ? Changing or stopping your regular medicines. This is especially important if you are taking diabetes medicines or blood thinners. ? Taking medicines such as aspirin and ibuprofen. These medicines can thin your blood. Do not take these medicines before your procedure if your health care provider instructs you not to.  Plan to have someone take you home from the hospital or clinic.  If you  will be going home right after the procedure, plan to have someone with you for 24 hours. What happens during the procedure?  To lower your risk of infection: ? Your health care team will wash or sanitize their hands. ? Your skin will be washed with soap. ? Hair may be removed from the incision area.  An IV tube will be inserted into one of your veins.  You will be given a medicine to help you relax (sedative).  The skin on your neck or groin will be numbed.  An incision will be made in your neck or your groin.  A needle will be inserted through the incision and into a large vein in your neck or groin.  A catheter will be inserted into the needle and moved to your heart.  Dye may be injected through the catheter to help your surgeon see the area of the heart that needs treatment.  Electrical currents will be sent from the catheter to ablate heart tissue in desired areas. There are three types of energy that may be used to ablate heart tissue: ? Heat (radiofrequency energy). ? Laser energy. ? Extreme cold (cryoablation).  When the necessary tissue has been ablated, the catheter will be removed.  Pressure will be held on the catheter insertion area to prevent excessive bleeding.  A bandage (dressing) will be placed over the catheter insertion area. The procedure may vary among health care providers and hospitals. What happens after the procedure?  Your blood pressure, heart rate, breathing rate, and blood oxygen level will be monitored until the medicines you were given have worn off.  Your catheter insertion area will be monitored for bleeding. You will need to lie still for a few hours to ensure that you do not bleed from the catheter insertion area.  Do not drive for 24 hours or as long as directed by your health care provider. Summary  Cardiac ablation is a procedure to disable (ablate) a small amount of heart tissue in very specific places. Ablating some of the problem  areas can improve the heart rhythm or return it to normal.  During the procedure, electrical currents will be sent from the catheter to ablate heart tissue in desired areas. This information is not intended to replace advice given to you by your health care provider. Make sure you discuss any questions you have with your health care provider. Document Released: 09/23/2008 Document Revised: 03/26/2016 Document Reviewed: 03/26/2016 Elsevier Interactive Patient Education  [employer].

## 2017-09-16 NOTE — Progress Notes (Signed)
PCP: Benita Stabile, MD Primary Cardiologist: Dr Wyline Mood Primary EP: Dr Johney Frame  Mary [name] is a 67 y.o. female who presents today for routine electrophysiology followup.  Since last being seen in our clinic, the patient reports doing very well.  Though she has had no afib, she reports very poor concentration with flecainide.  She does not feel that she can continue this long term.  Today, she denies symptoms of palpitations, chest pain, shortness of breath,  lower extremity edema, dizziness, presyncope, or syncope.  The patient is otherwise without complaint today.   Past Medical History:  Diagnosis Date  . Anemia    as a child  . Arthritis   . Asthma    has a rescue inhaler  . Atrial fibrillation (HCC) 07/25/2017  . Complication of anesthesia    slow to wake up  . Diverticulosis   . History of blood transfusion 1979   previously diagnosed with Hep C, though recently tested negative per patient  . History of bronchitis 2016  . Pneumonia    last time about 2 yrs ago  . Primary localized osteoarthritis of left knee 11/29/2015  . Seasonal allergies    takes Zyrtec daily and uses Flonase   Past Surgical History:  Procedure Laterality Date  . COLONOSCOPY    . COLONOSCOPY N/A 11/02/2013   Procedure: COLONOSCOPY;  Surgeon: Corbin Ade, MD;  Location: [facility];  Service: Endoscopy;  Laterality: N/A;  10:30 AM  . ESOPHAGOGASTRODUODENOSCOPY    . JOINT REPLACEMENT     left knee  . KNEE ARTHROSCOPY Left 11/29/2015   Procedure: LEFT KNEE ARTHROSCOPY CHONDROPLASTY ;  Surgeon: Teryl Lucy, MD;  [facility]: [facility] OR;  Service: Orthopedics;  Laterality: Left;  . KNEE SURGERY Left 11/29/2015   Left knee arthroscopy with chondroplasty of the patellofemoral joint, with open unicompartmental left medial knee replacement  . PARTIAL KNEE ARTHROPLASTY Left 11/29/2015   Procedure: LEFT UNICOMPARTMENTAL KNEE ARTHROPLASTY;  Surgeon: Teryl Lucy, MD;  Location: [facility] OR;  Service: Orthopedics;   Laterality: Left;  . REFRACTIVE SURGERY    . Right knee arthroscopy Right   . TUBAL LIGATION      ROS- all systems are reviewed and negatives except as per HPI above  Current Outpatient Medications  Medication Sig Dispense Refill  . acetaminophen (TYLENOL) 325 MG tablet Take 650 mg by mouth every 6 (six) hours as needed.    Marland Kitchen Bioflavonoid Products (BIOFLEX PO) Take 1 tablet by mouth daily.    . cetirizine (ZYRTEC) 10 MG tablet Take 10 mg by mouth daily.    Marland Kitchen diltiazem (CARDIZEM CD) 120 MG 24 hr capsule Take 1 capsule (120 mg total) by mouth daily. 30 capsule 6  . flecainide (TAMBOCOR) 100 MG tablet Take 1 tablet (100 mg total) by mouth 2 (two) times daily. 60 tablet 3  . Multiple Vitamin (MULTIVITAMIN) tablet Take 1 tablet by mouth daily as needed (for supplementation).     . rivaroxaban (XARELTO) 20 MG TABS tablet Take 1 tablet (20 mg total) by mouth daily. 30 tablet 6  . albuterol (PROVENTIL HFA;VENTOLIN HFA) 108 (90 Base) MCG/ACT inhaler Inhale 2 puffs into the lungs every 6 (six) hours as needed for wheezing or shortness of breath.    . fluticasone (FLONASE) 50 MCG/ACT nasal spray Place 1 spray into both nostrils daily.     . metoprolol tartrate (LOPRESSOR) 25 MG tablet Take 1 tablet every 6-8 hours as needed for afib if HR is over 100 (Patient  not taking: Reported on 09/16/2017) 60 tablet 0   No current facility-administered medications for this visit.     Physical Exam: Vitals:   09/16/17 0924  BP: 126/74  Pulse: (!) 59  Weight: 172 lb (78 kg)  Height:  (1.676 m)    GEN- The patient is well appearing, alert and oriented x 3 today.   Head- normocephalic, atraumatic Eyes-  Sclera clear, conjunctiva pink Ears- hearing intact Oropharynx- clear Lungs- Clear to ausculation bilaterally, normal work of breathing Heart- Regular rate and rhythm, no murmurs, rubs or gallops, PMI not laterally displaced GI- soft, NT, ND, + BS Extremities- no clubbing, cyanosis, or edema  EKG  tracing ordered today is personally reviewed and shows sinus rhythm 59 bpm , PR 206 msec, QTc 449 msec, rsR'  Assessment and Plan:  1. Paroxysmal atrial fibrillation chads2vasc score is 2 on xarelto. Doing well at this time. Though she has had no afib, she reports very poor concentration with flecainide.  She does not feel that she can continue this long term.  Therapeutic strategies for afib including medicine (multaq, sotalol, tikosyn) and ablation were discussed in detail with the patient today. Risk, benefits, and alternatives to EP study and radiofrequency ablation for afib were also discussed in detail today. These risks include but are not limited to stroke, bleeding, vascular damage, tamponade, perforation, damage to the esophagus, lungs, and other structures, pulmonary vein stenosis, worsening renal function, and death. The patient understands these risk and wishes to proceed.  We will therefore proceed with catheter ablation at the next available time.  Will plan cardiac CT prior to ablation.   Hillis Range MD, [facility] 09/16/2017 9:50 AM

## 2017-09-16 NOTE — H&P (View-Only) (Signed)
 PCP: Hall, John Z, MD Primary Cardiologist: Dr Branch Primary EP: Dr Jossalin Chervenak  Mary [name] is a 66 y.o. female who presents today for routine electrophysiology followup.  Since last being seen in our clinic, the patient reports doing very well.  Though she has had no afib, she reports very poor concentration with flecainide.  She does not feel that she can continue this long term.  Today, she denies symptoms of palpitations, chest pain, shortness of breath,  lower extremity edema, dizziness, presyncope, or syncope.  The patient is otherwise without complaint today.   Past Medical History:  Diagnosis Date  . Anemia    as a child  . Arthritis   . Asthma    has a rescue inhaler  . Atrial fibrillation (HCC) 07/25/2017  . Complication of anesthesia    slow to wake up  . Diverticulosis   . History of blood transfusion 1979   previously diagnosed with Hep C, though recently tested negative per patient  . History of bronchitis 2016  . Pneumonia    last time about 2 yrs ago  . Primary localized osteoarthritis of left knee 11/29/2015  . Seasonal allergies    takes Zyrtec daily and uses Flonase   Past Surgical History:  Procedure Laterality Date  . COLONOSCOPY    . COLONOSCOPY N/A 11/02/2013   Procedure: COLONOSCOPY;  Surgeon: Robert M Rourk, MD;  Location: [facility];  Service: Endoscopy;  Laterality: N/A;  10:30 AM  . ESOPHAGOGASTRODUODENOSCOPY    . JOINT REPLACEMENT     left knee  . KNEE ARTHROSCOPY Left 11/29/2015   Procedure: LEFT KNEE ARTHROSCOPY CHONDROPLASTY ;  Surgeon: Joshua Landau, MD;  Location: [facility] OR;  Service: Orthopedics;  Laterality: Left;  . KNEE SURGERY Left 11/29/2015   Left knee arthroscopy with chondroplasty of the patellofemoral joint, with open unicompartmental left medial knee replacement  . PARTIAL KNEE ARTHROPLASTY Left 11/29/2015   Procedure: LEFT UNICOMPARTMENTAL KNEE ARTHROPLASTY;  Surgeon: Joshua Landau, MD;  Location: [facility] OR;  Service: Orthopedics;   Laterality: Left;  . REFRACTIVE SURGERY    . Right knee arthroscopy Right   . TUBAL LIGATION      ROS- all systems are reviewed and negatives except as per HPI above  Current Outpatient Medications  Medication Sig Dispense Refill  . acetaminophen (TYLENOL) 325 MG tablet Take 650 mg by mouth every 6 (six) hours as needed.    . Bioflavonoid Products (BIOFLEX PO) Take 1 tablet by mouth daily.    . cetirizine (ZYRTEC) 10 MG tablet Take 10 mg by mouth daily.    . diltiazem (CARDIZEM CD) 120 MG 24 hr capsule Take 1 capsule (120 mg total) by mouth daily. 30 capsule 6  . flecainide (TAMBOCOR) 100 MG tablet Take 1 tablet (100 mg total) by mouth 2 (two) times daily. 60 tablet 3  . Multiple Vitamin (MULTIVITAMIN) tablet Take 1 tablet by mouth daily as needed (for supplementation).     . rivaroxaban (XARELTO) 20 MG TABS tablet Take 1 tablet (20 mg total) by mouth daily. 30 tablet 6  . albuterol (PROVENTIL HFA;VENTOLIN HFA) 108 (90 Base) MCG/ACT inhaler Inhale 2 puffs into the lungs every 6 (six) hours as needed for wheezing or shortness of breath.    . fluticasone (FLONASE) 50 MCG/ACT nasal spray Place 1 spray into both nostrils daily.     . metoprolol tartrate (LOPRESSOR) 25 MG tablet Take 1 tablet every 6-8 hours as needed for afib if HR is over 100 (Patient   not taking: Reported on 09/16/2017) 60 tablet 0   No current facility-administered medications for this visit.     Physical Exam: Vitals:   09/16/17 0924  BP: 126/74  Pulse: (!) 59  Weight: 172 lb (78 kg)  Height:  (1.676 m)    GEN- The patient is well appearing, alert and oriented x 3 today.   Head- normocephalic, atraumatic Eyes-  Sclera clear, conjunctiva pink Ears- hearing intact Oropharynx- clear Lungs- Clear to ausculation bilaterally, normal work of breathing Heart- Regular rate and rhythm, no murmurs, rubs or gallops, PMI not laterally displaced GI- soft, NT, ND, + BS Extremities- no clubbing, cyanosis, or edema  EKG  tracing ordered today is personally reviewed and shows sinus rhythm 59 bpm , PR 206 msec, QTc 449 msec, rsR'  Assessment and Plan:  1. Paroxysmal atrial fibrillation chads2vasc score is 2 on xarelto. Doing well at this time. Though she has had no afib, she reports very poor concentration with flecainide.  She does not feel that she can continue this long term.  Therapeutic strategies for afib including medicine (multaq, sotalol, tikosyn) and ablation were discussed in detail with the patient today. Risk, benefits, and alternatives to EP study and radiofrequency ablation for afib were also discussed in detail today. These risks include but are not limited to stroke, bleeding, vascular damage, tamponade, perforation, damage to the esophagus, lungs, and other structures, pulmonary vein stenosis, worsening renal function, and death. The patient understands these risk and wishes to proceed.  We will therefore proceed with catheter ablation at the next available time.  Will plan cardiac CT prior to ablation.   Hillis Range MD, [facility] 09/16/2017 9:50 AM

## 2017-09-30 ENCOUNTER — Other Ambulatory Visit: Payer: Medicare Other | Admitting: *Deleted

## 2017-09-30 DIAGNOSIS — I480 Paroxysmal atrial fibrillation: Secondary | ICD-10-CM

## 2017-09-30 DIAGNOSIS — Z01812 Encounter for preprocedural laboratory examination: Secondary | ICD-10-CM

## 2017-09-30 LAB — CBC WITH DIFFERENTIAL/PLATELET
BASOS: 0 %
Basophils Absolute: 0 10*3/uL (ref 0.0–0.2)
EOS (ABSOLUTE): 0.4 10*3/uL (ref 0.0–0.4)
EOS: 6 %
HEMATOCRIT: 41 % (ref 34.0–46.6)
Hemoglobin: 14 g/dL (ref 11.1–15.9)
IMMATURE GRANULOCYTES: 0 %
Immature Grans (Abs): 0 10*3/uL (ref 0.0–0.1)
LYMPHS ABS: 1.6 10*3/uL (ref 0.7–3.1)
Lymphs: 22 %
MCH: 28.9 pg (ref 26.6–33.0)
MCHC: 34.1 g/dL (ref 31.5–35.7)
MCV: 85 fL (ref 79–97)
MONOS ABS: 0.6 10*3/uL (ref 0.1–0.9)
Monocytes: 9 %
NEUTROS PCT: 63 %
Neutrophils Absolute: 4.4 10*3/uL (ref 1.4–7.0)
PLATELETS: 288 10*3/uL (ref 150–379)
RBC: 4.84 x10E6/uL (ref 3.77–5.28)
RDW: 13.5 % (ref 12.3–15.4)
WBC: 7.1 10*3/uL (ref 3.4–10.8)

## 2017-09-30 LAB — BASIC METABOLIC PANEL
BUN / CREAT RATIO: 17 (ref 12–28)
BUN: 15 mg/dL (ref 8–27)
CO2: 23 mmol/L (ref 20–29)
CREATININE: 0.9 mg/dL (ref 0.57–1.00)
Calcium: 9.3 mg/dL (ref 8.7–10.3)
Chloride: 103 mmol/L (ref 96–106)
GFR calc Af Amer: 77 mL/min/{1.73_m2} (ref >59)
GFR calc non Af Amer: 67 mL/min/{1.73_m2} (ref >59)
GLUCOSE: 86 mg/dL (ref 65–99)
POTASSIUM: 4.6 mmol/L (ref 3.5–5.2)
SODIUM: 139 mmol/L (ref 134–144)

## 2017-10-07 ENCOUNTER — Ambulatory Visit (HOSPITAL_COMMUNITY)
Admission: RE | Admit: 2017-10-07 | Discharge: 2017-10-07 | Disposition: A | Discharge status: Home / Self Care | Payer: Medicare Other | Source: Ambulatory Visit | Attending: Internal Medicine | Admitting: Internal Medicine

## 2017-10-07 DIAGNOSIS — I4891 Unspecified atrial fibrillation: Secondary | ICD-10-CM | POA: Diagnosis not present

## 2017-10-07 DIAGNOSIS — I480 Paroxysmal atrial fibrillation: Secondary | ICD-10-CM

## 2017-10-07 DIAGNOSIS — I712 Thoracic aortic aneurysm, without rupture: Secondary | ICD-10-CM | POA: Diagnosis not present

## 2017-10-07 DIAGNOSIS — Z01812 Encounter for preprocedural laboratory examination: Secondary | ICD-10-CM

## 2017-10-07 MED ORDER — NITROGLYCERIN 0.4 MG SL SUBL
SUBLINGUAL_TABLET | SUBLINGUAL | Status: AC
  Administered 2017-10-07: 0.4 mg
  Filled 2017-10-07: qty 1

## 2017-10-07 MED ORDER — IOPAMIDOL (ISOVUE-370) INJECTION 76%
INTRAVENOUS | Status: AC
  Administered 2017-10-07: 100 mL
  Filled 2017-10-07: qty 100

## 2017-10-09 NOTE — Anesthesia Preprocedure Evaluation (Addendum)
Anesthesia Evaluation  Patient identified by MRN, date of birth, ID band Patient awake    Reviewed: Allergy & Precautions, NPO status , Patient's Chart, lab work & pertinent test results  Airway Mallampati: I  TM Distance: >3 FB Neck ROM: Full    Dental no notable dental hx. (+) Teeth Intact, Dental Advisory Given   Pulmonary asthma ,    Pulmonary exam normal breath sounds clear to auscultation       Cardiovascular Normal cardiovascular exam+ dysrhythmias Atrial Fibrillation  Rhythm:Regular Rate:Normal  ECG: SB, rate 59. LAD. Incomplete RBBB  ECHO:  Left ventricle: The cavity size was normal. Wall thickness was normal. Systolic function was normal. The estimated ejection fraction was in the range of 60% to 65%. The study is not technically sufficient to allow evaluation of LV diastolic function.    Neuro/Psych negative neurological ROS  negative psych ROS   GI/Hepatic negative GI ROS, Neg liver ROS,   Endo/Other  negative endocrine ROS  Renal/GU negative Renal ROS     Musculoskeletal negative musculoskeletal ROS (+)   Abdominal   Peds  Hematology negative hematology ROS (+)   Anesthesia Other Findings   Reproductive/Obstetrics                           Anesthesia Physical Anesthesia Plan  ASA: III  Anesthesia Plan: General   Post-op Pain Management:    Induction: Intravenous  PONV Risk Score and Plan: 3 and Ondansetron, Dexamethasone, Midazolam and Treatment may vary due to age or medical condition  Airway Management Planned: Oral ETT  Additional Equipment:   Intra-op Plan:   Post-operative Plan: Extubation in OR  Informed Consent: I have reviewed the patients History and Physical, chart, labs and discussed the procedure including the risks, benefits and alternatives for the proposed anesthesia with the patient or authorized representative who has indicated his/her  understanding and acceptance.   Dental advisory given  Plan Discussed with: CRNA  Anesthesia Plan Comments:         Anesthesia Quick Evaluation

## 2017-10-10 ENCOUNTER — Ambulatory Visit (HOSPITAL_COMMUNITY): Payer: Medicare Other | Admitting: Anesthesiology

## 2017-10-10 ENCOUNTER — Ambulatory Visit (HOSPITAL_COMMUNITY)
Admission: RE | Admit: 2017-10-10 | Discharge: 2017-10-10 | Disposition: A | Discharge status: Home / Self Care | Payer: Medicare Other | Source: Ambulatory Visit | Attending: Internal Medicine | Admitting: Internal Medicine

## 2017-10-10 ENCOUNTER — Encounter (HOSPITAL_COMMUNITY): Payer: Self-pay | Admitting: Anesthesiology

## 2017-10-10 ENCOUNTER — Encounter (HOSPITAL_COMMUNITY)
Admission: RE | Disposition: A | Discharge status: Home / Self Care | Payer: Self-pay | Source: Ambulatory Visit | Attending: Internal Medicine

## 2017-10-10 DIAGNOSIS — I4510 Unspecified right bundle-branch block: Secondary | ICD-10-CM | POA: Diagnosis not present

## 2017-10-10 DIAGNOSIS — Z7951 Long term (current) use of inhaled steroids: Secondary | ICD-10-CM | POA: Insufficient documentation

## 2017-10-10 DIAGNOSIS — I480 Paroxysmal atrial fibrillation: Secondary | ICD-10-CM | POA: Insufficient documentation

## 2017-10-10 DIAGNOSIS — M1990 Unspecified osteoarthritis, unspecified site: Secondary | ICD-10-CM | POA: Diagnosis not present

## 2017-10-10 DIAGNOSIS — J45909 Unspecified asthma, uncomplicated: Secondary | ICD-10-CM | POA: Diagnosis not present

## 2017-10-10 DIAGNOSIS — Z7901 Long term (current) use of anticoagulants: Secondary | ICD-10-CM | POA: Diagnosis not present

## 2017-10-10 HISTORY — PX: ATRIAL FIBRILLATION ABLATION: EP1191

## 2017-10-10 LAB — POCT ACTIVATED CLOTTING TIME
ACTIVATED CLOTTING TIME: 301 s
ACTIVATED CLOTTING TIME: 307 s
ACTIVATED CLOTTING TIME: 169 s
Activated Clotting Time: 324 seconds

## 2017-10-10 SURGERY — ATRIAL FIBRILLATION ABLATION
Anesthesia: General

## 2017-10-10 SURGERY — Surgical Case
Anesthesia: *Unknown

## 2017-10-10 MED ORDER — DEXAMETHASONE SODIUM PHOSPHATE 10 MG/ML IJ SOLN
INTRAMUSCULAR | Status: DC | PRN
  Administered 2017-10-10: 5 mg via INTRAVENOUS

## 2017-10-10 MED ORDER — SODIUM CHLORIDE 0.9% FLUSH: 3.0000 mL | INTRAVENOUS | Status: DC | PRN

## 2017-10-10 MED ORDER — ISOPROTERENOL HCL 0.2 MG/ML IJ SOLN
INTRAMUSCULAR | Status: AC
  Filled 2017-10-10: qty 5

## 2017-10-10 MED ORDER — ACETAMINOPHEN 325 MG PO TABS: 650.0000 mg | ORAL_TABLET | ORAL | Status: DC | PRN

## 2017-10-10 MED ORDER — HYDROCODONE-ACETAMINOPHEN 5-325 MG PO TABS: 1.0000 | ORAL_TABLET | ORAL | Status: DC | PRN

## 2017-10-10 MED ORDER — BUPIVACAINE HCL (PF) 0.25 % IJ SOLN
INTRAMUSCULAR | Status: DC | PRN
  Administered 2017-10-10: 30 mL

## 2017-10-10 MED ORDER — PROTAMINE SULFATE 10 MG/ML IV SOLN
INTRAVENOUS | Status: DC | PRN
  Administered 2017-10-10: 40 mg via INTRAVENOUS

## 2017-10-10 MED ORDER — DEXTROSE 5 % IV SOLN
INTRAVENOUS | Status: DC | PRN
  Administered 2017-10-10: 20 ug/kg/min via INTRAVENOUS

## 2017-10-10 MED ORDER — HEPARIN SODIUM (PORCINE) 1000 UNIT/ML IJ SOLN
INTRAMUSCULAR | Status: AC
  Filled 2017-10-10: qty 1

## 2017-10-10 MED ORDER — HEPARIN (PORCINE) IN NACL 2-0.9 UNITS/ML
INTRAMUSCULAR | Status: AC | PRN
  Administered 2017-10-10: 500 mL

## 2017-10-10 MED ORDER — SODIUM CHLORIDE 0.9% FLUSH: 3.0000 mL | Freq: Two times a day (BID) | INTRAVENOUS | Status: DC

## 2017-10-10 MED ORDER — IOPAMIDOL (ISOVUE-370) INJECTION 76%
INTRAVENOUS | Status: AC
  Filled 2017-10-10: qty 50

## 2017-10-10 MED ORDER — SODIUM CHLORIDE 0.9 % IV SOLN: 250.0000 mL | INTRAVENOUS | Status: DC | PRN

## 2017-10-10 MED ORDER — PROPOFOL 10 MG/ML IV BOLUS
INTRAVENOUS | Status: DC | PRN
  Administered 2017-10-10: 50 mg via INTRAVENOUS
  Administered 2017-10-10: 100 mg via INTRAVENOUS

## 2017-10-10 MED ORDER — HEPARIN SODIUM (PORCINE) 1000 UNIT/ML IJ SOLN
INTRAMUSCULAR | Status: DC | PRN
  Administered 2017-10-10: 12000 [IU] via INTRAVENOUS
  Administered 2017-10-10: 1000 [IU] via INTRAVENOUS

## 2017-10-10 MED ORDER — PHENYLEPHRINE 40 MCG/ML (10ML) SYRINGE FOR IV PUSH (FOR BLOOD PRESSURE SUPPORT)
PREFILLED_SYRINGE | INTRAVENOUS | Status: DC | PRN
  Administered 2017-10-10: 40 ug via INTRAVENOUS

## 2017-10-10 MED ORDER — IOPAMIDOL (ISOVUE-370) INJECTION 76%
INTRAVENOUS | Status: DC | PRN
  Administered 2017-10-10: 2 mL via INTRAVENOUS

## 2017-10-10 MED ORDER — PANTOPRAZOLE SODIUM 40 MG PO TBEC: 40.0000 mg | DELAYED_RELEASE_TABLET | Freq: Every day | ORAL | 0 refills | Status: DC

## 2017-10-10 MED ORDER — BUPIVACAINE HCL (PF) 0.25 % IJ SOLN
INTRAMUSCULAR | Status: AC
  Filled 2017-10-10: qty 30

## 2017-10-10 MED ORDER — PHENYLEPHRINE HCL 10 MG/ML IJ SOLN
INTRAMUSCULAR | Status: DC | PRN
  Administered 2017-10-10: 10 ug/min via INTRAVENOUS

## 2017-10-10 MED ORDER — ROCURONIUM BROMIDE 100 MG/10ML IV SOLN
INTRAVENOUS | Status: DC | PRN
  Administered 2017-10-10: 50 mg via INTRAVENOUS
  Administered 2017-10-10: 10 mg via INTRAVENOUS
  Administered 2017-10-10: 20 mg via INTRAVENOUS

## 2017-10-10 MED ORDER — HEPARIN SODIUM (PORCINE) 1000 UNIT/ML IJ SOLN
INTRAMUSCULAR | Status: DC | PRN
  Administered 2017-10-10: 2000 [IU] via INTRAVENOUS
  Administered 2017-10-10: 3000 [IU] via INTRAVENOUS

## 2017-10-10 MED ORDER — MIDAZOLAM HCL 5 MG/5ML IJ SOLN
INTRAMUSCULAR | Status: DC | PRN
  Administered 2017-10-10: 1 mg via INTRAVENOUS

## 2017-10-10 MED ORDER — SUGAMMADEX SODIUM 500 MG/5ML IV SOLN
INTRAVENOUS | Status: DC | PRN
  Administered 2017-10-10: 300 mg via INTRAVENOUS

## 2017-10-10 MED ORDER — FENTANYL CITRATE (PF) 100 MCG/2ML IJ SOLN
INTRAMUSCULAR | Status: DC | PRN
  Administered 2017-10-10: 75 ug via INTRAVENOUS

## 2017-10-10 MED ORDER — ONDANSETRON HCL 4 MG/2ML IJ SOLN
INTRAMUSCULAR | Status: DC | PRN
  Administered 2017-10-10: 4 mg via INTRAVENOUS

## 2017-10-10 MED ORDER — ONDANSETRON HCL 4 MG/2ML IJ SOLN: 4.0000 mg | Freq: Four times a day (QID) | INTRAMUSCULAR | Status: DC | PRN

## 2017-10-10 MED ORDER — HEPARIN (PORCINE) IN NACL 1000-0.9 UT/500ML-% IV SOLN
INTRAVENOUS | Status: AC
  Filled 2017-10-10: qty 500

## 2017-10-10 MED ORDER — LIDOCAINE HCL (CARDIAC) PF 100 MG/5ML IV SOSY
PREFILLED_SYRINGE | INTRAVENOUS | Status: DC | PRN
  Administered 2017-10-10: 75 mg via INTRAVENOUS

## 2017-10-10 MED ORDER — SODIUM CHLORIDE 0.9 % IV SOLN
INTRAVENOUS | Status: DC
  Administered 2017-10-10: 06:00:00 via INTRAVENOUS

## 2017-10-10 SURGICAL SUPPLY — 18 items
BLANKET WARM UNDERBOD FULL ACC (MISCELLANEOUS) ×3 IMPLANT
CATH MAPPNG PENTARAY F 2-6-2MM (CATHETERS) ×1 IMPLANT
CATH NAVISTAR SMARTTOUCH DF (ABLATOR) ×3 IMPLANT
CATH SOUNDSTAR 3D IMAGING (CATHETERS) ×3 IMPLANT
CATH WEBSTER BI DIR CS D-F CRV (CATHETERS) ×3 IMPLANT
COVER SWIFTLINK CONNECTOR (BAG) ×3 IMPLANT
NDL BAYLIS TRANSSEPTAL 71CM (NEEDLE) IMPLANT
NEEDLE BAYLIS TRANSSEPTAL 71CM (NEEDLE) ×3 IMPLANT
PACK EP LATEX FREE (CUSTOM PROCEDURE TRAY) ×3
PACK EP LF (CUSTOM PROCEDURE TRAY) ×1 IMPLANT
PAD DEFIB LIFELINK (PAD) ×3 IMPLANT
PATCH CARTO3 (PAD) ×6 IMPLANT
PENTARAY F 2-6-2MM (CATHETERS) ×3
SHEATH AVANTI 11F 11CM (SHEATH) ×3 IMPLANT
SHEATH PINNACLE 7F 10CM (SHEATH) ×6 IMPLANT
SHEATH PINNACLE 9F 10CM (SHEATH) ×3 IMPLANT
SHEATH SWARTZ TS SL2 63CM 8.5F (SHEATH) ×3 IMPLANT
TUBING SMART ABLATE COOLFLOW (TUBING) ×3 IMPLANT

## 2017-10-10 NOTE — Discharge Instructions (Signed)
No driving for 4 days. No lifting over 5 lbs for 1 week. No sexual activity for 1 week. You may return to work in 1 week. Keep procedure site clean & dry. If you notice increased pain, swelling, bleeding or pus, call/return!  You may shower, but no soaking baths/hot tubs/pools for 1 week.   You have an appointment set up with the Atrial Fibrillation Clinic.  Multiple studies have shown that being followed by a dedicated atrial fibrillation clinic in addition to the standard care you receive from your other physicians improves health. We believe that enrollment in the atrial fibrillation clinic will allow [place] to better care for you.   The phone number to the Atrial Fibrillation Clinic is [phone]. The clinic is staffed Monday through Friday from 8:30am to 5pm.  Parking Directions: The clinic is located in the Heart and Vascular Building connected to [facility]. 1)From [address] turn on to [address] and go to the 3rd entrance  (Heart and Vascular entrance) on the right. 2)Look to the right for Heart &Vascular Parking Garage. 3)A code for the entrance is required please call the clinic to receive this.   4)Take the elevators to the 1st floor. Registration is in the room with the glass walls at the end of the hallway.  If you have any trouble parking or locating the clinic, please dont hesitate to call [phone].    Cardiac Ablation, Care After This sheet gives you information about how to care for yourself after your procedure. Your health care provider may also give you more specific instructions. If you have problems or questions, contact your health care provider. What can I expect after the procedure? After the procedure, it is common to have:  Bruising around your puncture site.  Tenderness around your puncture site.  Skipped heartbeats.  Tiredness (fatigue).  Follow these instructions at home: Puncture site care  Follow instructions from your health care  provider about how to take care of your puncture site. Make sure you: ? Wash your hands with soap and water before you change your bandage (dressing). If soap and water are not available, use hand sanitizer. ? Change your dressing as told by your health care provider. ? Leave stitches (sutures), skin glue, or adhesive strips in place. These skin closures may need to stay in place for up to 2 weeks. If adhesive strip edges start to loosen and curl up, you may trim the loose edges. Do not remove adhesive strips completely unless your health care provider tells you to do that.  Check your puncture site every day for signs of infection. Check for: ? Redness, swelling, or pain. ? Fluid or blood. If your puncture site starts to bleed, lie down on your back, apply firm pressure to the area, and contact your health care provider. ? Warmth. ? Pus or a bad smell. Driving  Ask your health care provider when it is safe for you to drive again after the procedure.  Do not drive or use heavy machinery while taking prescription pain medicine.  Do not drive for 24 hours if you were given a medicine to help you relax (sedative) during your procedure. Activity  Avoid activities that take a lot of effort for at least 3 days after your procedure.  Do not lift anything that is heavier than 10 lb (4.5 kg), or the limit that you are told, until your health care provider says that it is safe.  Return to your normal activities as  told by your health care provider. Ask your health care provider what activities are safe for you. General instructions  Take over-the-counter and prescription medicines only as told by your health care provider.  Do not use any products that contain nicotine or tobacco, such as cigarettes and e-cigarettes. If you need help quitting, ask your health care provider.  Do not take baths, swim, or use a hot tub until your health care provider approves.  Do not drink alcohol for 24 hours  after your procedure.  Keep all follow-up visits as told by your health care provider. This is important. Contact a health care provider if:  You have redness, mild swelling, or pain around your puncture site.  You have fluid or blood coming from your puncture site that stops after applying firm pressure to the area.  Your puncture site feels warm to the touch.  You have pus or a bad smell coming from your puncture site.  You have a fever.  You have chest pain or discomfort that spreads to your neck, jaw, or arm.  You are sweating a lot.  You feel nauseous.  You have a fast or irregular heartbeat.  You have shortness of breath.  You are dizzy or light-headed and feel the need to lie down.  You have pain or numbness in the arm or leg closest to your puncture site. Get help right away if:  Your puncture site suddenly swells.  Your puncture site is bleeding and the bleeding does not stop after applying firm pressure to the area. These symptoms may represent a serious problem that is an emergency. Do not wait to see if the symptoms will go away. Get medical help right away. Call your local emergency services (911 in the [place] Do not drive yourself to the hospital. Summary  After the procedure, it is normal to have bruising and tenderness at the puncture site in your groin, neck, or forearm.  Check your puncture site every day for signs of infection.  Get help right away if your puncture site is bleeding and the bleeding does not stop after applying firm pressure to the area. This is a medical emergency. This information is not intended to replace advice given to you by your health care provider. Make sure you discuss any questions you have with your health care provider. Document Released: 08/16/2016 Document Revised: 08/16/2016 Document Reviewed: 08/16/2016 Elsevier Interactive Patient Education  2018 [employer]  Femoral Site Care Refer to this sheet in the next few  weeks. These instructions provide you with information about caring for yourself after your procedure. Your health care provider may also give you more specific instructions. Your treatment has been planned according to current medical practices, but problems sometimes occur. Call your health care provider if you have any problems or questions after your procedure. What can I expect after the procedure? After your procedure, it is typical to have the following:  Bruising at the site that usually fades within 1-2 weeks.  Blood collecting in the tissue (hematoma) that may be painful to the touch. It should usually decrease in size and tenderness within 1-2 weeks.  Follow these instructions at home:  Take medicines only as directed by your health care provider.  You may shower 24-48 hours after the procedure or as directed by your health care provider. Remove the bandage (dressing) and gently wash the site with plain soap and water. Pat the area dry with a clean towel. Do not rub the site, because  this may cause bleeding.  Do not take baths, swim, or use a hot tub until your health care provider approves.  Check your insertion site every day for redness, swelling, or drainage.  Do not apply powder or lotion to the site.  Limit use of stairs to twice a day for the first 2-3 days or as directed by your health care provider.  Do not squat for the first 2-3 days or as directed by your health care provider.  Do not lift over 10 lb (4.5 kg) for 5 days after your procedure or as directed by your health care provider.  Ask your health care provider when it is okay to: ? Return to work or school. ? Resume usual physical activities or sports. ? Resume sexual activity.  Do not drive home if you are discharged the same day as the procedure. Have someone else drive you.  You may drive 24 hours after the procedure unless otherwise instructed by your health care provider.  Do not operate machinery  or power tools for 24 hours after the procedure or as directed by your health care provider.  If your procedure was done as an outpatient procedure, which means that you went home the same day as your procedure, a responsible adult should be with you for the first 24 hours after you arrive home.  Keep all follow-up visits as directed by your health care provider. This is important. Contact a health care provider if:  You have a fever.  You have chills.  You have increased bleeding from the site. Hold pressure on the site. Get help right away if:  You have unusual pain at the site.  You have redness, warmth, or swelling at the site.  You have drainage (other than a small amount of blood on the dressing) from the site.  The site is bleeding, and the bleeding does not stop after 30 minutes of holding steady pressure on the site.  Your leg or foot becomes pale, cool, tingly, or numb. This information is not intended to replace advice given to you by your health care provider. Make sure you discuss any questions you have with your health care provider. Document Released: 01/08/2014 Document Revised: 10/13/2015 Document Reviewed: 11/24/2013 Elsevier Interactive Patient Education  [employer].

## 2017-10-10 NOTE — Interval H&P Note (Signed)
History and Physical Interval Note:  10/10/2017 7:24 AM  Mary [name]  has presented today for surgery, with the diagnosis of afib  The various methods of treatment have been discussed with the patient and family. After consideration of risks, benefits and other options for treatment, the patient has consented to  Procedure(s): ATRIAL FIBRILLATION ABLATION (N/A) as a surgical intervention .  The patient's history has been reviewed, patient examined, no change in status, stable for surgery.  I have reviewed the patient's chart and labs.  Questions were answered to the patient's satisfaction.    Cardiac CT reviewed with patient.  She reports compliance with xarelto without interruption.   Hillis Range

## 2017-10-10 NOTE — Progress Notes (Signed)
Site area: 3 rt fv sheaths Site Prior to Removal:  Level 0 Pressure Applied For: 20 minutes Manual:   yes Patient Status During Pull:  stable Post Pull Site:  Level 0 Post Pull Instructions Given:  yes Post Pull Pulses Present: palpable Dressing Applied:  Gauze and tegaderm Bedrest begins @ 1115 Comments: IV saline locked

## 2017-10-10 NOTE — Transfer of Care (Signed)
Immediate Anesthesia Transfer of Care Note  Patient: Mary [name]  Procedure(s) Performed: ATRIAL FIBRILLATION ABLATION (N/A )  Patient Location: Cath Lab  Anesthesia Type:General  Level of Consciousness: awake, alert  and oriented  Airway & Oxygen Therapy: Patient Spontanous Breathing and Patient connected to nasal cannula oxygen  Post-op Assessment: Report given to RN, Post -op Vital signs reviewed and stable and Patient moving all extremities  Post vital signs: Reviewed and stable  Last Vitals:  Vitals Value Taken Time  BP 112/69 10/10/2017 10:36 AM  Temp 36.3 C 10/10/2017 10:32 AM  Pulse 65 10/10/2017 10:37 AM  Resp 17 10/10/2017 10:37 AM  SpO2 97 % 10/10/2017 10:37 AM  Vitals shown include unvalidated device data.  Last Pain:  Vitals:   10/10/17 1032  TempSrc: Temporal  PainSc: 0-No pain         Complications: No apparent anesthesia complications

## 2017-10-10 NOTE — Anesthesia Procedure Notes (Signed)
Procedure Name: Intubation Date/Time: 10/10/2017 8:00 AM Performed by: Kyung Rudd, CRNA Pre-anesthesia Checklist: Patient identified, Emergency Drugs available, Suction available and Patient being monitored Patient Re-evaluated:Patient Re-evaluated prior to induction Oxygen Delivery Method: Circle system utilized Preoxygenation: Pre-oxygenation with 100% oxygen Induction Type: IV induction Ventilation: Mask ventilation without difficulty Laryngoscope Size: Mac and 3 Grade View: Grade I Tube type: Oral Tube size: 7.0 mm Number of attempts: 1 Airway Equipment and Method: Stylet Placement Confirmation: ETT inserted through vocal cords under direct vision,  positive ETCO2 and breath sounds checked- equal and bilateral Secured at: 21 cm Tube secured with: Tape Dental Injury: Teeth and Oropharynx as per pre-operative assessment

## 2017-10-11 ENCOUNTER — Encounter (HOSPITAL_COMMUNITY): Payer: Self-pay | Admitting: Internal Medicine

## 2017-10-14 NOTE — Anesthesia Postprocedure Evaluation (Signed)
Anesthesia Post Note  Patient: Mary [name]  Procedure(s) Performed: ATRIAL FIBRILLATION ABLATION (N/A )     Patient location during evaluation: PACU Anesthesia Type: General Level of consciousness: awake and alert Pain management: pain level controlled Vital Signs Assessment: post-procedure vital signs reviewed and stable Respiratory status: spontaneous breathing, nonlabored ventilation, respiratory function stable and patient connected to nasal cannula oxygen Cardiovascular status: blood pressure returned to baseline and stable Postop Assessment: no apparent nausea or vomiting Anesthetic complications: no    Last Vitals:  Vitals:   10/10/17 1645 10/10/17 1700  BP: 105/69 104/75  Pulse: 73 78  Resp: 19 (!) 23  Temp:    SpO2: 96% 96%    Last Pain:  Vitals:   10/10/17 1730  TempSrc:   PainSc: 0-No pain                 Loreta Blouch P Cougar Imel

## 2017-11-05 ENCOUNTER — Encounter (HOSPITAL_COMMUNITY): Payer: Self-pay | Admitting: Nurse Practitioner

## 2017-11-05 ENCOUNTER — Ambulatory Visit (HOSPITAL_COMMUNITY)
Admission: RE | Admit: 2017-11-05 | Discharge: 2017-11-05 | Disposition: A | Discharge status: Home / Self Care | Payer: Medicare Other | Source: Ambulatory Visit | Attending: Nurse Practitioner | Admitting: Nurse Practitioner

## 2017-11-05 VITALS — BP 106/76 | HR 76 | Ht 66.0 in | Wt 170.0 lb

## 2017-11-05 DIAGNOSIS — M1712 Unilateral primary osteoarthritis, left knee: Secondary | ICD-10-CM | POA: Insufficient documentation

## 2017-11-05 DIAGNOSIS — R9431 Abnormal electrocardiogram [ECG] [EKG]: Secondary | ICD-10-CM | POA: Diagnosis not present

## 2017-11-05 DIAGNOSIS — Z9889 Other specified postprocedural states: Secondary | ICD-10-CM | POA: Diagnosis not present

## 2017-11-05 DIAGNOSIS — Z880 Allergy status to penicillin: Secondary | ICD-10-CM | POA: Insufficient documentation

## 2017-11-05 DIAGNOSIS — I480 Paroxysmal atrial fibrillation: Secondary | ICD-10-CM | POA: Diagnosis present

## 2017-11-05 DIAGNOSIS — Z79899 Other long term (current) drug therapy: Secondary | ICD-10-CM | POA: Diagnosis not present

## 2017-11-05 DIAGNOSIS — Z7901 Long term (current) use of anticoagulants: Secondary | ICD-10-CM | POA: Insufficient documentation

## 2017-11-05 HISTORY — DX: Paroxysmal atrial fibrillation: I48.0

## 2017-11-05 NOTE — Progress Notes (Signed)
Primary Care Physician: Benita Stabile, MD Primary Cardiologist: Wyline Mood Primary Electrophysiologist: Johney Frame  Mary [name] is a 67 y.o. female with a history of paroxysmal atrial fibrillation who presents for follow up in the [facility].  Since last being seen in clinic, the patient reports doing very well.  Today, she denies symptoms of palpitations, chest pain, shortness of breath, orthopnea, PND, lower extremity edema, dizziness, presyncope, syncope, snoring, daytime somnolence, bleeding, or neurologic sequela. The patient is tolerating medications without difficulties and is otherwise without complaint today.   She has not had any procedure related complications and feels "great".  She took her last dose of Flecainide end of last week and asks today about stopping diltiazem.    Atrial Fibrillation Risk Factors:  she does not have symptoms or diagnosis of sleep apnea.  she does not have a history of rheumatic fever.  she does not have a history of alcohol use.  she has a BMI of Body mass index is 27.44 kg/m.Marland Kitchen Filed Weights   11/05/17 1000  Weight: 170 lb (77.1 kg)    LA size: 42   Atrial Fibrillation Management history:  Previous antiarrhythmic drugs: Flecainide  Previous cardioversions: none  Previous ablations: 09/2017  CHADS2VASC score: 2 (female, age)  Anticoagulation history: Xarelto   Past Medical History:  Diagnosis Date  . Asthma   . Complication of anesthesia    slow to wake up  . Diverticulosis   . History of blood transfusion 1979   previously diagnosed with Hep C, though recently tested negative per patient  . History of bronchitis 2016  . Paroxysmal atrial fibrillation (HCC) 07/25/2017  . Primary localized osteoarthritis of left knee 11/29/2015  . Seasonal allergies    Past Surgical History:  Procedure Laterality Date  . ATRIAL FIBRILLATION ABLATION N/A 10/10/2017   Procedure: ATRIAL FIBRILLATION ABLATION;  Surgeon:  Hillis Range, MD;  [facility];  Service: Cardiovascular;  Laterality: N/A;  . COLONOSCOPY N/A 11/02/2013   Procedure: COLONOSCOPY;  Surgeon: Corbin Ade, MD;  [facility];  Service: Endoscopy;  Laterality: N/A;  10:30 AM  . ESOPHAGOGASTRODUODENOSCOPY    . KNEE ARTHROSCOPY Left 11/29/2015   Procedure: LEFT KNEE ARTHROSCOPY CHONDROPLASTY ;  Surgeon: Teryl Lucy, MD;  Location: [facility] OR;  Service: Orthopedics;  Laterality: Left;  . PARTIAL KNEE ARTHROPLASTY Left 11/29/2015   Procedure: LEFT UNICOMPARTMENTAL KNEE ARTHROPLASTY;  Surgeon: Teryl Lucy, MD;  Location: [facility] OR;  Service: Orthopedics;  Laterality: Left;  . REFRACTIVE SURGERY    . Right knee arthroscopy Right   . TUBAL LIGATION      Current Outpatient Medications  Medication Sig Dispense Refill  . acetaminophen (TYLENOL) 325 MG tablet Take 325 mg by mouth daily as needed for moderate pain or headache.     . albuterol (PROVENTIL HFA;VENTOLIN HFA) 108 (90 Base) MCG/ACT inhaler Inhale 2 puffs into the lungs every 6 (six) hours as needed for wheezing or shortness of breath.    . Carboxymethylcellul-Glycerin (LUBRICATING EYE DROPS OP) Place 1 drop into both eyes daily as needed (dry eyes).    . cetirizine (ZYRTEC) 10 MG tablet Take 10 mg by mouth at bedtime.     . cholecalciferol (VITAMIN D) 1000 units tablet Take 1,000 Units by mouth 3 (three) times a week.    . fluticasone (FLONASE) 50 MCG/ACT nasal spray Place 1 spray into both nostrils daily.     . rivaroxaban (XARELTO) 20 MG TABS tablet Take 1  tablet (20 mg total) by mouth daily. (Patient taking differently: Take 20 mg by mouth every evening. ) 30 tablet 6  . metoprolol tartrate (LOPRESSOR) 25 MG tablet Take 1 tablet every 6-8 hours as needed for afib if HR is over 100 (Patient not taking: Reported on 11/05/2017) 60 tablet 0   No current facility-administered medications for this encounter.     Allergies  Allergen Reactions  . Penicillins Hives and  Swelling    Has patient had a PCN reaction causing immediate rash, facial/tongue/throat swelling, SOB or lightheadedness with hypotension: No  Has patient had a PCN reaction causing severe rash involving mucus membranes or skin necrosis: No Has patient had a PCN reaction that required hospitalization No Has patient had a PCN reaction occurring within the last 10 years: No If all of the above answers are "NO", then may proceed with Cephalosporin use.  Patient stated that she recalls feet swelling ONLY  . Shellfish Allergy Diarrhea    Severe    Social History   Socioeconomic History  . Marital status: Married    Spouse name: Not on file  . Number of children: Not on file  . Years of education: Not on file  . Highest education level: Not on file  Occupational History  . Not on file  Social Needs  . Financial resource strain: Not on file  . Food insecurity:    Worry: Not on file    Inability: Not on file  . Transportation needs:    Medical: Not on file    Non-medical: Not on file  Tobacco Use  . Smoking status: Never Smoker  . Smokeless tobacco: Never Used  Substance and Sexual Activity  . Alcohol use: Yes    Comment: occasionally  . Drug use: No  . Sexual activity: Yes  Lifestyle  . Physical activity:    Days per week: Not on file    Minutes per session: Not on file  . Stress: Not on file  Relationships  . Social connections:    Talks on phone: Not on file    Gets together: Not on file    Attends religious service: Not on file    Active member of club or organization: Not on file    Attends meetings of clubs or organizations: Not on file    Relationship status: Not on file  . Intimate partner violence:    Fear of current or ex partner: Not on file    Emotionally abused: Not on file    Physically abused: Not on file    Forced sexual activity: Not on file  Other Topics Concern  . Not on file  Social History Narrative   Pt lives in [place] with spouse.  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 COPD Mother   . Heart disease Mother   . Non-Hodgkin's lymphoma Father   . Atrial fibrillation Brother   . Colon cancer Neg Hx     ROS- All systems are reviewed and negative except as per the HPI above.  Physical Exam: Vitals:   11/05/17 1000  BP: 106/76  Pulse: 76  Weight: 170 lb (77.1 kg)  Height: 5\' 6"  (1.676 m)    GEN- The patient is well appearing, alert and oriented x 3 today.   Head- normocephalic, atraumatic Eyes-  Sclera clear, conjunctiva pink Ears- hearing intact Oropharynx- clear Neck- supple  Lungs- Clear to ausculation bilaterally, normal work of breathing Heart- Regular rate and rhythm GI- soft, NT, ND, + BS Extremities- no clubbing, cyanosis, or edema MS- no significant deformity or atrophy Skin- no rash or lesion Psych- euthymic mood, full affect Neuro- strength and sensation are intact  Wt Readings from Last 3 Encounters:  11/05/17 170 lb (77.1 kg)  10/10/17 168 lb (76.2 kg)  09/16/17 172 lb (78 kg)    EKG today demonstrates sinus rhythm, rate 76, normal intervals Echo 07/2017 demonstrated normal LVEF, LA 42  Epic records are reviewed at length today  Assessment and Plan:  1. Paroxysmal atrial fibrillation Doing well post ablation without procedure related complications Off Flecainide for 4 days. Will stop Diltiazem today Continue Xarelto for CHADS2VASC of 2 until seen by Dr Johney FrameAllred. Reviewed importance of compliance until 3 months post ablation   Follow up with Dr Johney FrameAllred as scheduled.   Gypsy BalsamAmber Seiler, NP 11/05/2017 10:31 AM

## 2018-01-13 ENCOUNTER — Ambulatory Visit: Payer: Medicare Other | Admitting: Internal Medicine

## 2018-01-13 ENCOUNTER — Encounter: Payer: Self-pay | Admitting: Internal Medicine

## 2018-01-13 VITALS — BP 110/72 | HR 68 | Ht 66.0 in | Wt 163.6 lb

## 2018-01-13 DIAGNOSIS — I480 Paroxysmal atrial fibrillation: Secondary | ICD-10-CM

## 2018-01-13 NOTE — Patient Instructions (Addendum)
Medication Instructions:  Your physician has recommended you make the following change in your medication:  1. STOP Xarelto  * If you need a refill on your cardiac medications before your next appointment, please call your pharmacy.   Labwork: None ordered .  Testing/Procedures: None ordered  Follow-Up: Your physician recommends that you schedule a follow-up appointment in: 3 months with Dr. Johney FrameAllred.  *Please note that any paperwork needing to be filled out by the provider will need to be addressed at the front desk prior to seeing the provider. Please note that any FMLA, disability or other documents regarding health condition is subject to a $25.00 charge that must be received prior to completion of paperwork in the form of a money order or check.  Thank you for choosing [facility]!!

## 2018-01-13 NOTE — Progress Notes (Signed)
PCP: Benita StabileHall, John Z, MD Primary Cardiologist: Dr Wyline MoodBranch EP: Elenie Coven  Mary [name] is a 67 y.o. female who presents today for routine electrophysiology followup.  Since his recent afib ablation, the patient reports doing very well.  she denies procedure related complications and is pleased with the results of the procedure.  Today, she denies symptoms of palpitations, chest pain, shortness of breath,  lower extremity edema, dizziness, presyncope, or syncope.  The patient is otherwise without complaint today.   Past Medical History:  Diagnosis Date  . Asthma   . Complication of anesthesia    slow to wake up  . Diverticulosis   . History of blood transfusion 1979   previously diagnosed with Hep C, though recently tested negative per patient  . History of bronchitis 2016  . Paroxysmal atrial fibrillation (HCC) 07/25/2017  . Primary localized osteoarthritis of left knee 11/29/2015  . Seasonal allergies    Past Surgical History:  Procedure Laterality Date  . ATRIAL FIBRILLATION ABLATION N/A 10/10/2017   Procedure: ATRIAL FIBRILLATION ABLATION;  Surgeon: Hillis RangeAllred, Marycruz Boehner, MD;  [facility];  Service: Cardiovascular;  Laterality: N/A;  . COLONOSCOPY N/A 11/02/2013   Procedure: COLONOSCOPY;  Surgeon: Corbin Adeobert M Rourk, MD;  [facility];  Service: Endoscopy;  Laterality: N/A;  10:30 AM  . ESOPHAGOGASTRODUODENOSCOPY    . KNEE ARTHROSCOPY Left 11/29/2015   Procedure: LEFT KNEE ARTHROSCOPY CHONDROPLASTY ;  Surgeon: Teryl LucyJoshua Landau, MD;  [facility];  Service: Orthopedics;  Laterality: Left;  . PARTIAL KNEE ARTHROPLASTY Left 11/29/2015   Procedure: LEFT UNICOMPARTMENTAL KNEE ARTHROPLASTY;  Surgeon: Teryl LucyJoshua Landau, MD;  Location: [facility] OR;  Service: Orthopedics;  Laterality: Left;  . REFRACTIVE SURGERY    . Right knee arthroscopy Right   . TUBAL LIGATION      ROS- all systems are personally reviewed and negatives except as per HPI above  Current Outpatient Medications    Medication Sig Dispense Refill  . acetaminophen (TYLENOL) 325 MG tablet Take 325 mg by mouth daily as needed for moderate pain or headache.     . albuterol (PROVENTIL HFA;VENTOLIN HFA) 108 (90 Base) MCG/ACT inhaler Inhale 2 puffs into the lungs every 6 (six) hours as needed for wheezing or shortness of breath.    . Carboxymethylcellul-Glycerin (LUBRICATING EYE DROPS OP) Place 1 drop into both eyes daily as needed (dry eyes).    . cetirizine (ZYRTEC) 10 MG tablet Take 10 mg by mouth at bedtime.     . cholecalciferol (VITAMIN D) 1000 units tablet Take 1,000 Units by mouth 3 (three) times a week.    . fluticasone (FLONASE) 50 MCG/ACT nasal spray Place 1 spray into both nostrils daily.     . metoprolol tartrate (LOPRESSOR) 25 MG tablet Take 1 tablet every 6-8 hours as needed for afib if HR is over 100 60 tablet 0  . rivaroxaban (XARELTO) 20 MG TABS tablet Take 1 tablet (20 mg total) by mouth daily. (Patient taking differently: Take 20 mg by mouth every evening. ) 30 tablet 6   No current facility-administered medications for this visit.     Physical Exam: Vitals:   01/13/18 1116  BP: 110/72  Pulse: 68  SpO2: 98%  Weight: 163 lb 9.6 oz (74.2 kg)  Height: 5\' 6"  (1.676 m)    GEN- The patient is well appearing, alert and oriented x 3 today.   Head- normocephalic, atraumatic Eyes-  Sclera clear, conjunctiva pink Ears- hearing intact Oropharynx- clear Lungs- Clear to ausculation bilaterally,  normal work of breathing Heart- Regular rate and rhythm, no murmurs, rubs or gallops, PMI not laterally displaced GI- soft, NT, ND, + BS Extremities- no clubbing, cyanosis, or edema  EKG tracing ordered today is personally reviewed and shows sinus rhythm 68 bpm, PR 134 msec, QTc 435 msec, incomplete RBBB  Assessment and Plan:  1. Paroxysmal atrial fibrillation Doing well s/p ablation chads2vasc score is 2 We discussed pros and cons to anticoagulation at length.  She is clear that she would like to  stop xarelto (as supported by 2019 AF guidelines)   Return to see me in 3 months  Hillis Range MD, [facility] 01/13/2018 11:45 AM

## 2018-04-23 ENCOUNTER — Ambulatory Visit: Payer: Medicare Other | Admitting: Internal Medicine

## 2018-11-05 IMAGING — CT CT HEART MORPH/PULM VEIN W/ CM & W/O CA SCORE
2 of 8 series · 6 of 20 positions shown, 7 images · IV contrast (APPLIED)
Comparison: Chest radiograph 07/25/2017

CLINICAL DATA: 67-year-old female with atrial fibrillation
scheduled for an ablation.

EXAM:
Cardiac CT/CTA
TECHNIQUE: The patient was scanned on a Siemens Somatom scanner.

[Series 12: best diast · axial · 0.34mm/px · z∈[-244,-182]mm · 3 of 308 slices shown, 4 images]
[im 77/308  vessel]
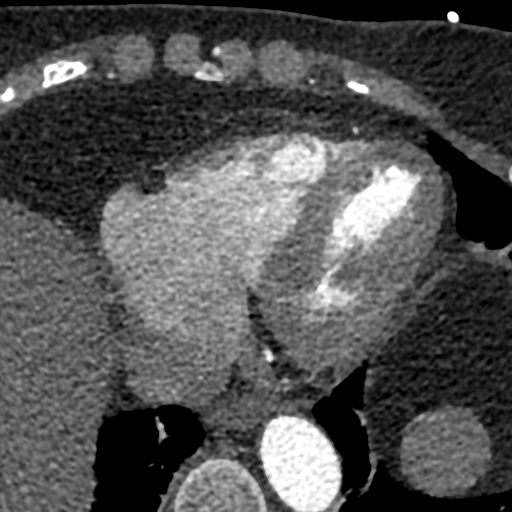
[im 77/308  lung]
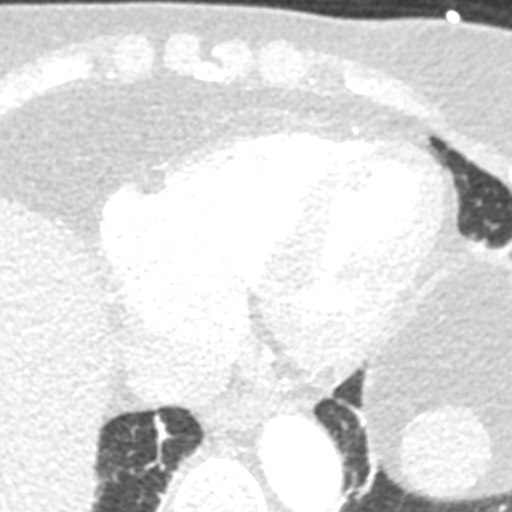
[im 154/308  vessel]
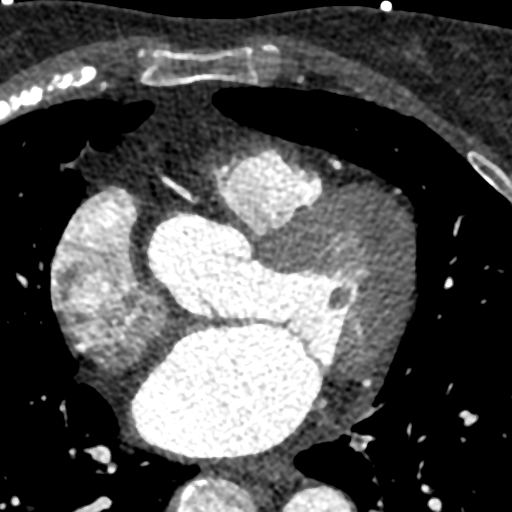
[im 231/308  vessel]
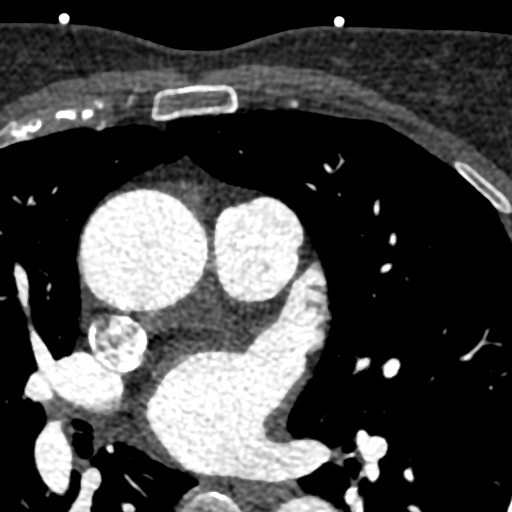

[Series 13: +300 ms · axial · 0.34mm/px · z∈[-244,-182]mm · 3 of 308 slices shown]
[im 77/308  vessel]
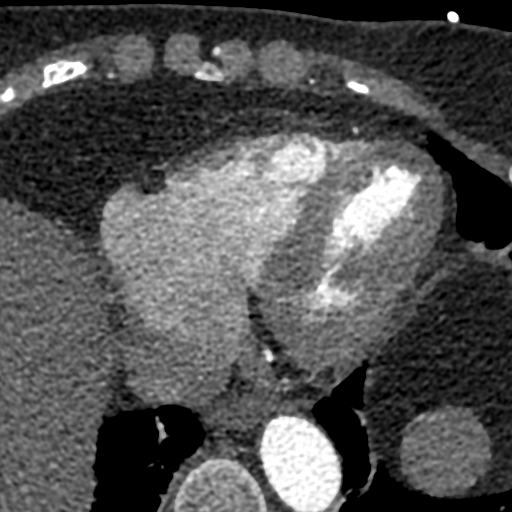
[im 154/308  vessel]
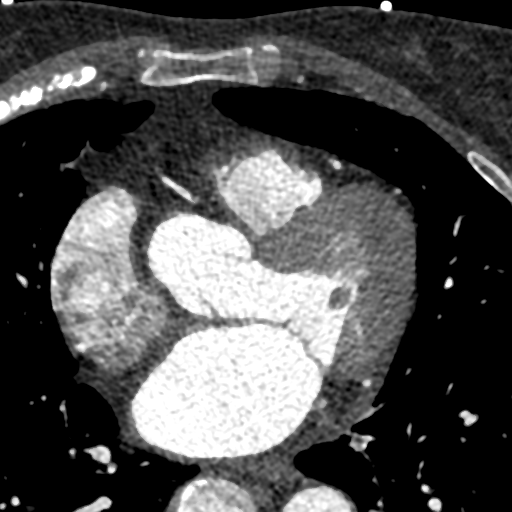
[im 231/308  vessel]
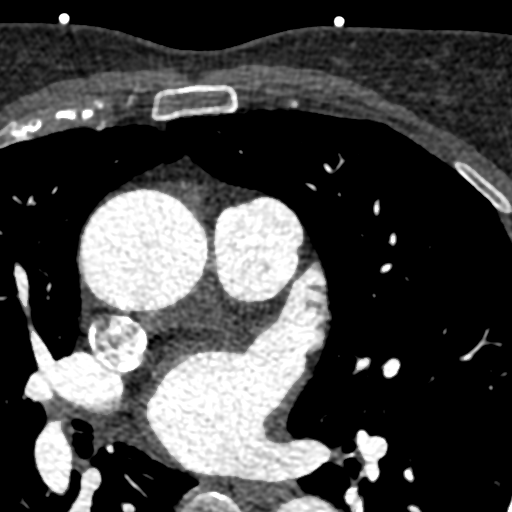

[6 of 20 positions shown; findings below may reference images not displayed]

FINDINGS: A 120 kV prospective scan was triggered in the descending thoracic
aorta at 111 HU's. Gantry rotation speed was 280 msecs and
collimation was .9 mm. No beta blockade and 0.4 mg sl NTG was given.
The 3D data set was reconstructed in 5% intervals of the 60-80 % of
the R-R cycle. Diastolic phases were analyzed on a dedicated work
station using MPR, MIP and VRT modes. The patient received 80 cc of
contrast.

There is normal pulmonary vein drainage into the left atrium (2 on
the right and 2 on the left) with ostial measurements as follows:

RUPV: 17.9 x 14.6 mm

RLPV: 17.6 x 15.9 mm

LUPV: 19.9 x 15.9 mm

LLPV: 17.0 x 14.0 mm

The left atrial appendage is fairly small, chicken wing type with
one lobe and no thrombus.

The esophagus runs to the left from the left atrial midline and is
in the proximity to the ostia of both of the left sided pulmonary
veins.

Aorta: Mild aneurysmal dilatation of the ascending aorta with
maximum diameter 40 mm (when measured in the double oblique view).
No dissection or calcifications.

Aortic Valve:  Trileaflet.  No calcifications.

Coronary Arteries: Normal coronary origin. Left dominance. Calcium
score 0. No CAD.
IMPRESSION: 1. There is normal pulmonary vein drainage into the left atrium.

2. The left atrial appendage is fairly small, chicken wing type with
one lobe and no thrombus.

3. The esophagus runs to the left from the left atrial midline and
is in the proximity to the ostia of both of the left sided pulmonary
veins.

4. Mild aneurysmal dilatation of the ascending aorta with maximum
diameter 40 mm (when measured in the double oblique view).

5. Normal coronary origin. Left dominance. Calcium score 0.  No CAD.

EXAM:
OVER-READ INTERPRETATION  CT CHEST

The following report is an over-read performed by radiologist Dr.
Quirijn Amazigh [REDACTED] on 10/07/2017. This over-read
does not include interpretation of cardiac or coronary anatomy or
pathology. The coronary calcium score/coronary CTA interpretation by
the cardiologist is attached.
FINDINGS: Vascular: Enlargement of the ascending thoracic aorta measuring up
to 4.3 cm. No evidence for a dissection in the visualized thoracic
aorta. Visualized pulmonary arteries are patent without large
filling defects.

Mediastinum/Nodes: No significant lymph node enlargement in the
visualized chest. Small amount of fluid in the pericardial recess.

Lungs/Pleura: No large pleural effusions. Linear and bandlike
densities in the right lower lobe are most compatible with
atelectasis or scarring.

Upper Abdomen: Images of the upper abdomen are unremarkable.

Musculoskeletal: No acute bone abnormality.
IMPRESSION: Aneurysm of the ascending thoracic aorta, measuring up to 4.3 cm.
Recommend annual imaging followup by CTA or MRA. This recommendation
follows 7161 ACCF/AHA/AATS/ACR/ASA/SCA/MAKEDA/GOLNAZ/LISA/DANANG INDOMULTINIAGA Guidelines
for the Diagnosis and Management of Patients with Thoracic Aortic
Disease. Circulation. 7161; 121: [id number] .

## 2019-11-18 ENCOUNTER — Encounter: Payer: Self-pay | Admitting: Internal Medicine

## 2019-11-18 ENCOUNTER — Ambulatory Visit: Payer: Medicare PPO | Admitting: Internal Medicine

## 2019-11-18 ENCOUNTER — Other Ambulatory Visit: Payer: Self-pay

## 2019-11-18 VITALS — BP 144/86 | HR 77 | Ht 66.0 in

## 2019-11-18 DIAGNOSIS — I4891 Unspecified atrial fibrillation: Secondary | ICD-10-CM | POA: Insufficient documentation

## 2019-11-18 DIAGNOSIS — I480 Paroxysmal atrial fibrillation: Secondary | ICD-10-CM | POA: Diagnosis not present

## 2019-11-18 NOTE — Patient Instructions (Signed)
Medication Instructions:  Your physician recommends that you continue on your current medications as directed. Please refer to the Current Medication list given to you today.  *If you need a refill on your cardiac medications before your next appointment, please call your pharmacy*  Lab Work: None ordered.  If you have labs (blood work) drawn today and your tests are completely normal, you will receive your results only by: . MyChart Message (if you have MyChart) OR . A paper copy in the mail If you have any lab test that is abnormal or we need to change your treatment, we will call you to review the results.  Testing/Procedures: None ordered.  Follow-Up: At [facility], you and your health needs are our priority.  As part of our continuing mission to provide you with exceptional heart care, we have created designated Provider Care Teams.  These Care Teams include your primary Cardiologist (physician) and Advanced Practice Providers (APPs -  Physician Assistants and Nurse Practitioners) who all work together to provide you with the care you need, when you need it.  We recommend signing up for the patient portal called "MyChart".  Sign up information is provided on this After Visit Summary.  MyChart is used to connect with patients for Virtual Visits (Telemedicine).  Patients are able to view lab/test results, encounter notes, upcoming appointments, etc.  Non-urgent messages can be sent to your provider as well.   To learn more about what you can do with MyChart, go to [email or web address].    Your next appointment:   Your physician wants you to follow-up in: 1 year with Dr. Allred. You will receive a reminder letter in the mail two months in advance. If you don't receive a letter, please call our office to schedule the follow-up appointment.   Other Instructions:  

## 2019-11-18 NOTE — Progress Notes (Signed)
PCP: System, Pcp Not In   Primary EP: Dr Johney Frame  Mary [name] is a 69 y.o. female who presents today for routine electrophysiology followup.  Since last being seen in our clinic, the patient reports doing very well.  She has very rare palpitations.  She is active.  She lives at [facility].  Today, she denies symptoms of  chest pain, shortness of breath,  lower extremity edema, dizziness, presyncope, or syncope.  The patient is otherwise without complaint today.   Past Medical History:  Diagnosis Date  . Asthma   . Complication of anesthesia    slow to wake up  . Diverticulosis   . History of blood transfusion 1979   previously diagnosed with Hep C, though recently tested negative per patient  . History of bronchitis 2016  . Paroxysmal atrial fibrillation (HCC) 07/25/2017  . Primary localized osteoarthritis of left knee 11/29/2015  . Seasonal allergies    Past Surgical History:  Procedure Laterality Date  . ATRIAL FIBRILLATION ABLATION N/A 10/10/2017   Procedure: ATRIAL FIBRILLATION ABLATION;  Surgeon: Hillis Range, MD;  [facility];  Service: Cardiovascular;  Laterality: N/A;  . COLONOSCOPY N/A 11/02/2013   Procedure: COLONOSCOPY;  Surgeon: Corbin Ade, MD;  [facility];  Service: Endoscopy;  Laterality: N/A;  10:30 AM  . ESOPHAGOGASTRODUODENOSCOPY    . KNEE ARTHROSCOPY Left 11/29/2015   Procedure: LEFT KNEE ARTHROSCOPY CHONDROPLASTY ;  Surgeon: Teryl Lucy, MD;  [facility];  Service: Orthopedics;  Laterality: Left;  . PARTIAL KNEE ARTHROPLASTY Left 11/29/2015   Procedure: LEFT UNICOMPARTMENTAL KNEE ARTHROPLASTY;  Surgeon: Teryl Lucy, MD;  Location: [facility] OR;  Service: Orthopedics;  Laterality: Left;  . REFRACTIVE SURGERY    . Right knee arthroscopy Right   . TUBAL LIGATION      ROS- all systems are reviewed and negatives except as per HPI above  Current Outpatient Medications  Medication Sig Dispense Refill  . acetaminophen (TYLENOL)  325 MG tablet Take 325 mg by mouth daily as needed for moderate pain or headache.     . albuterol (PROVENTIL HFA;VENTOLIN HFA) 108 (90 Base) MCG/ACT inhaler Inhale 2 puffs into the lungs every 6 (six) hours as needed for wheezing or shortness of breath.    . Carboxymethylcellul-Glycerin (LUBRICATING EYE DROPS OP) Place 1 drop into both eyes daily as needed (dry eyes).    . cetirizine (ZYRTEC) 10 MG tablet Take 10 mg by mouth at bedtime.     . cholecalciferol (VITAMIN D) 1000 units tablet Take 1,000 Units by mouth 3 (three) times a week.    . ezetimibe (ZETIA) 10 MG tablet Take 10 mg by mouth daily.    . fluticasone (FLONASE) 50 MCG/ACT nasal spray Place 1 spray into both nostrils daily.     . Multiple Vitamins-Minerals (MULTIVITAMIN WITH MINERALS) tablet Take 1 tablet by mouth daily.    Marland Kitchen NAPROXEN PO Take 220 mg by mouth as needed (pain).     No current facility-administered medications for this visit.    Physical Exam: Vitals:   11/18/19 1344  BP: (!) 144/86  Pulse: 77  SpO2: 95%  Height: 5\' 6"  (1.676 m)    GEN- The patient is well appearing, alert and oriented x 3 today.   Head- normocephalic, atraumatic Eyes-  Sclera clear, conjunctiva pink Ears- hearing intact Oropharynx- clear Lungs-   normal work of breathing Heart- Regular rate and rhythm  GI- soft  Extremities- no clubbing, cyanosis, or edema  Wt  Readings from Last 3 Encounters:  01/13/18 163 lb 9.6 oz (74.2 kg)  11/05/17 170 lb (77.1 kg)  10/10/17 168 lb (76.2 kg)    EKG tracing ordered today is personally reviewed and shows sinus  Event monitor from 3/21 reveals sinus with nonsustained atach (<1% burden), no afib  Assessment and Plan:  1. Paroxysmal atrial fibrillation Doing well s/p ablation chads2vasc score is 2.  She does not require OAC at this time  Risks, benefits and potential toxicities for medications prescribed and/or refilled reviewed with patient today.   Return in a year  Hillis Range MD,  [facility] 11/18/2019 2:24 PM

## 2023-10-02 ENCOUNTER — Encounter: Payer: Self-pay | Admitting: *Deleted
